# Patient Record
Sex: Male | Born: 1955 | Race: Black or African American | Hispanic: No | Marital: Married | Smoking: Never smoker
Health system: Southern US, Community
[De-identification: ages and names within clinical notes are randomized; demographics above are authoritative.]

## PROBLEM LIST (undated history)

## (undated) DIAGNOSIS — H409 Unspecified glaucoma: Secondary | ICD-10-CM

## (undated) DIAGNOSIS — J302 Other seasonal allergic rhinitis: Secondary | ICD-10-CM

## (undated) DIAGNOSIS — R011 Cardiac murmur, unspecified: Secondary | ICD-10-CM

## (undated) DIAGNOSIS — K4030 Unilateral inguinal hernia, with obstruction, without gangrene, not specified as recurrent: Secondary | ICD-10-CM

## (undated) DIAGNOSIS — N400 Enlarged prostate without lower urinary tract symptoms: Secondary | ICD-10-CM

## (undated) DIAGNOSIS — N390 Urinary tract infection, site not specified: Secondary | ICD-10-CM

## (undated) DIAGNOSIS — I10 Essential (primary) hypertension: Secondary | ICD-10-CM

## (undated) HISTORY — PX: MULTIPLE TOOTH EXTRACTIONS: SHX2053

---
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
## 2005-12-23 ENCOUNTER — Ambulatory Visit (HOSPITAL_COMMUNITY): Admission: RE | Admit: 2005-12-23 | Discharge: 2005-12-23 | Payer: Self-pay | Admitting: Family Medicine

## 2007-08-06 IMAGING — CR DG CHEST 2V
2 series · 2 of 2 positions shown · non-contrast
Comparison: none

CLINICAL DATA: Cough

Chest 2 view:
No previous for comparison. The heart size and mediastinal contours are within
normal limits.  Both lungs are clear.  The visualized skeletal structures are
unremarkable.

[view not recorded (1 of 2)]
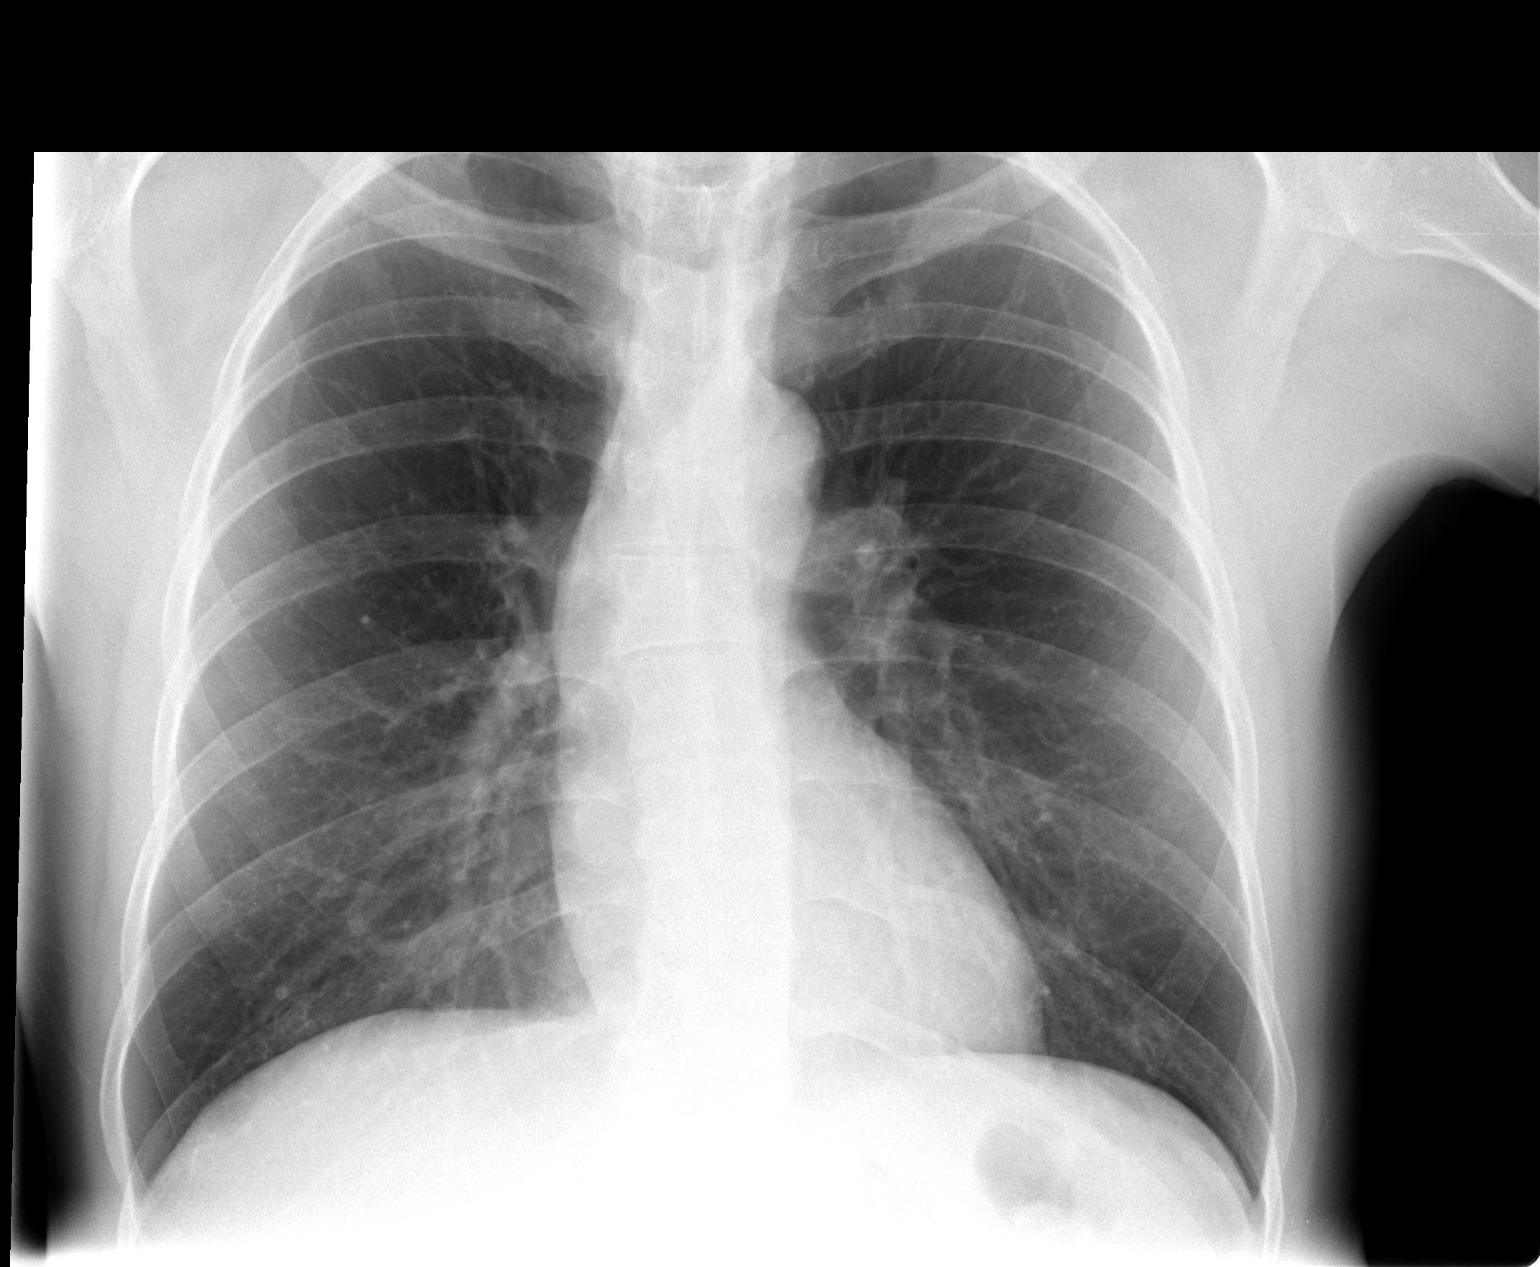

[view not recorded (2 of 2)]
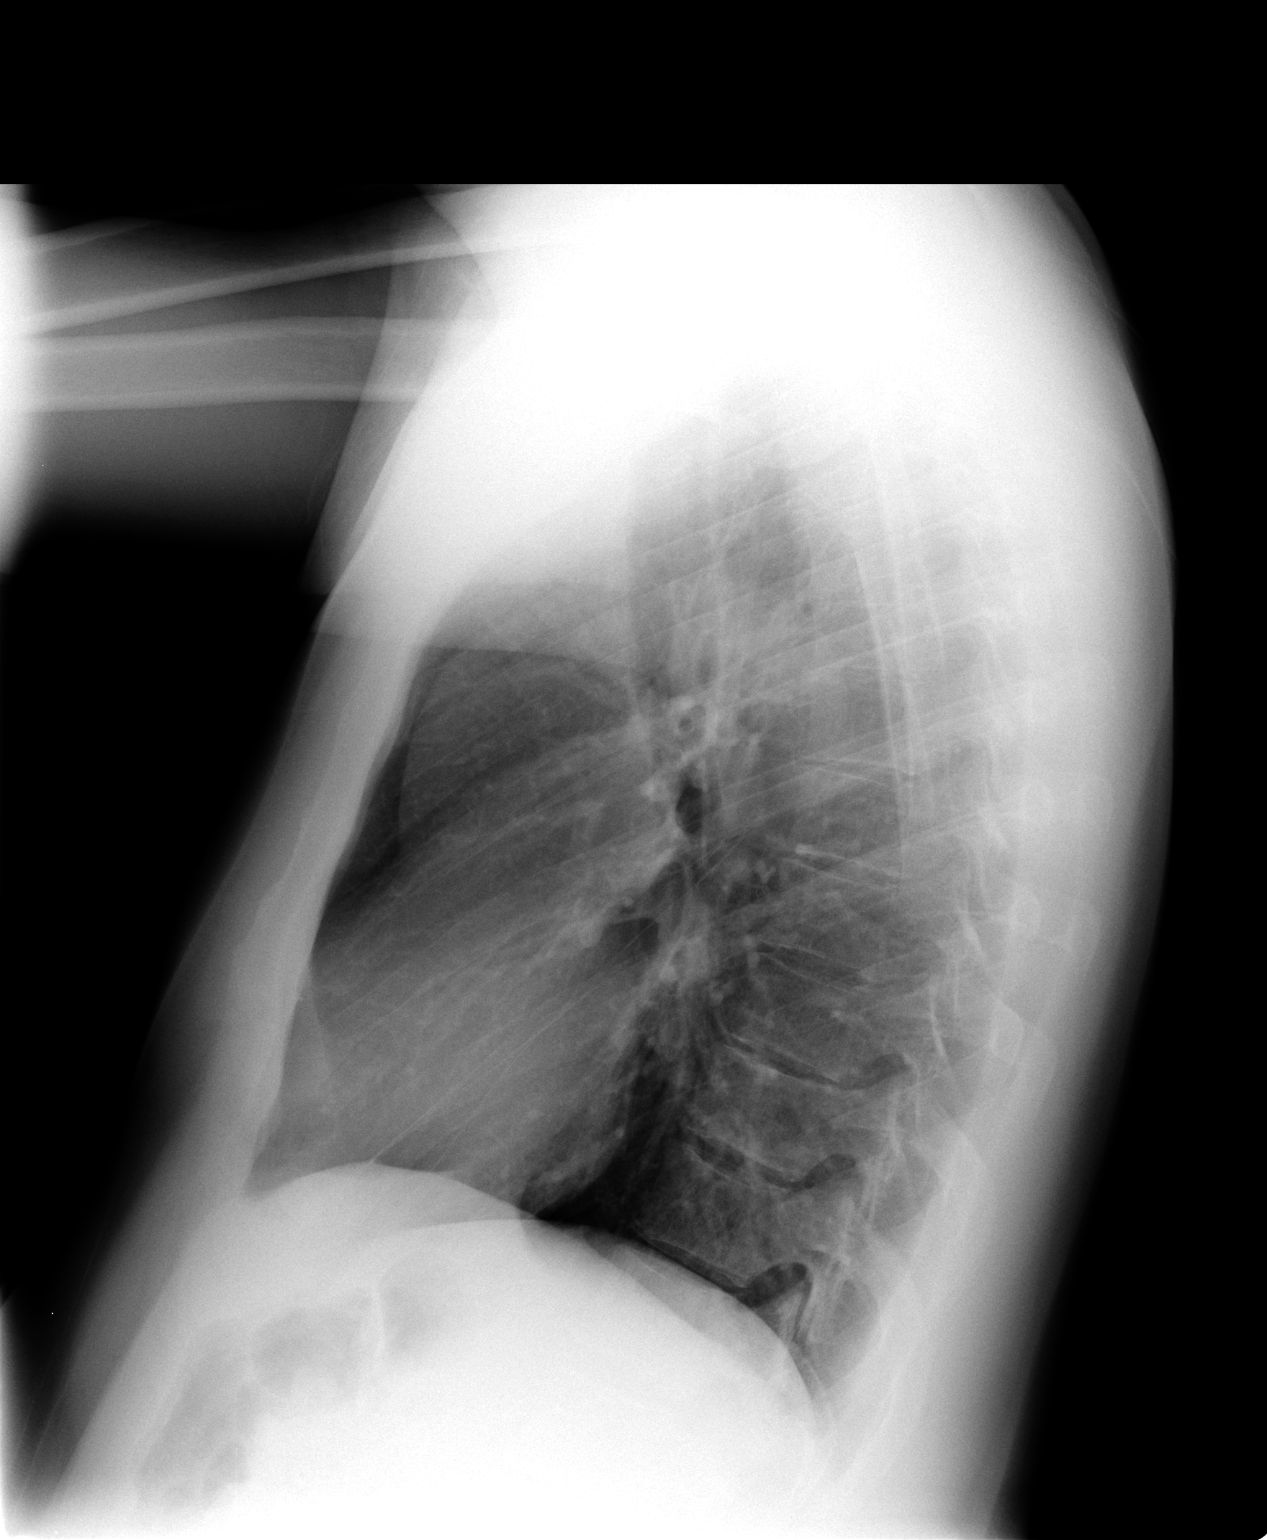

[2 of 2 positions shown; findings below may reference images not displayed]

IMPRESSION: 1. No active cardiopulmonary disease.

## 2013-04-02 ENCOUNTER — Ambulatory Visit (INDEPENDENT_AMBULATORY_CARE_PROVIDER_SITE_OTHER): Payer: No Typology Code available for payment source | Admitting: Surgery

## 2013-04-02 ENCOUNTER — Encounter (INDEPENDENT_AMBULATORY_CARE_PROVIDER_SITE_OTHER): Payer: Self-pay | Admitting: Surgery

## 2013-04-02 VITALS — BP 138/80 | HR 79 | Temp 98.0°F | Resp 18 | Ht 71.0 in | Wt 205.0 lb

## 2013-04-02 DIAGNOSIS — K4090 Unilateral inguinal hernia, without obstruction or gangrene, not specified as recurrent: Secondary | ICD-10-CM | POA: Insufficient documentation

## 2013-04-02 NOTE — Patient Instructions (Signed)
Inguinal Hernia, Adult  Care After Refer to this sheet in the next few weeks. These discharge instructions provide you with general information on caring for yourself after you leave the hospital. Your caregiver may also give you specific instructions. Your treatment has been planned according to the most current medical practices available, but unavoidable complications sometimes occur. If you have any problems or questions after discharge, please call your caregiver. HOME CARE INSTRUCTIONS  Put ice on the operative site.  Put ice in a plastic bag.  Place a towel between your skin and the bag.  Leave the ice on for 15-20 minutes at a time, 3-4 times a day while awake.  Change bandages (dressings) as directed.  Keep the wound dry and clean. The wound may be washed gently with soap and water. Gently blot or dab the wound dry. It is okay to take showers 24 to 48 hours after surgery. Do not take baths, use swimming pools, or use hot tubs for 10 days, or as directed by your caregiver.  Only take over-the-counter or prescription medicines for pain, discomfort, or fever as directed by your caregiver.  Continue your normal diet as directed.  Do not lift anything more than 10 pounds or play contact sports for 3 weeks, or as directed. SEEK MEDICAL CARE IF:  There is redness, swelling, or increasing pain in the wound.  There is fluid (pus) coming from the wound.  There is drainage from a wound lasting longer than 1 day.  You have an oral temperature above 102 F (38.9 C).  You notice a bad smell coming from the wound or dressing.  The wound breaks open after the stitches (sutures) have been removed.  You notice increasing pain in the shoulders (shoulder strap areas).  You develop dizzy episodes or fainting while standing.  You feel sick to your stomach (nauseous) or throw up (vomit). SEEK IMMEDIATE MEDICAL CARE IF:  You develop a rash.  You have difficulty breathing.  You  develop a reaction or have side effects to medicines you were given. MAKE SURE YOU:   Understand these instructions.  Will watch your condition.  Will get help right away if you are not doing well or get worse. Document Released: 11/11/2006 Document Revised: 01/03/2012 Document Reviewed: 09/10/2009 [facility] Patient Information 2014 [place], [place]. Inguinal Hernia, Adult Muscles help keep everything in the body in its proper place. But if a weak spot in the muscles develops, something can poke through. That is called a hernia. When this happens in the lower part of the belly (abdomen), it is called an inguinal hernia. (It takes its name from a part of the body in this region called the inguinal canal.) A weak spot in the wall of muscles lets some fat or part of the small intestine bulge through. An inguinal hernia can develop at any age. Men get them more often than women. CAUSES  In adults, an inguinal hernia develops over time.  It can be triggered by:  Suddenly straining the muscles of the lower abdomen.  Lifting heavy objects.  Straining to have a bowel movement. Difficult bowel movements (constipation) can lead to this.  Constant coughing. This may be caused by smoking or lung disease.  Being overweight.  Being pregnant.  Working at a job that requires long periods of standing or heavy lifting.  Having had an inguinal hernia before. One type can be an emergency situation. It is called a strangulated inguinal hernia. It develops if part of the small  intestine slips through the weak spot and cannot get back into the abdomen. The blood supply can be cut off. If that happens, part of the intestine may die. This situation requires emergency surgery. SYMPTOMS  Often, a small inguinal hernia has no symptoms. It is found when a healthcare provider does a physical exam. Larger hernias usually have symptoms.   In adults, symptoms may include:  A lump in the groin. This is easier to  see when the person is standing. It might disappear when lying down.  In men, a lump in the scrotum.  Pain or burning in the groin. This occurs especially when lifting, straining or coughing.  A dull ache or feeling of pressure in the groin.  Signs of a strangulated hernia can include:  A bulge in the groin that becomes very painful and tender to the touch.  A bulge that turns red or purple.  Fever, nausea and vomiting.  Inability to have a bowel movement or to pass gas. DIAGNOSIS  To decide if you have an inguinal hernia, a healthcare provider will probably do a physical examination.  This will include asking questions about any symptoms you have noticed.  The healthcare provider might feel the groin area and ask you to cough. If an inguinal hernia is felt, the healthcare provider may try to slide it back into the abdomen.  Usually no other tests are needed. TREATMENT  Treatments can vary. The size of the hernia makes a difference. Options include:  Watchful waiting. This is often suggested if the hernia is small and you have had no symptoms.  No medical procedure will be done unless symptoms develop.  You will need to watch closely for symptoms. If any occur, contact your healthcare provider right away.  Surgery. This is used if the hernia is larger or you have symptoms.  Open surgery. This is usually an outpatient procedure (you will not stay overnight in a hospital). An cut (incision) is made through the skin in the groin. The hernia is put back inside the abdomen. The weak area in the muscles is then repaired by herniorrhaphy or hernioplasty. Herniorrhaphy: in this type of surgery, the weak muscles are sewn back together. Hernioplasty: a patch or mesh is used to close the weak area in the abdominal wall.  Laparoscopy. In this procedure, a surgeon makes small incisions. A thin tube with a tiny video camera (called a laparoscope) is put into the abdomen. The surgeon repairs  the hernia with mesh by looking with the video camera and using two long instruments. HOME CARE INSTRUCTIONS   After surgery to repair an inguinal hernia:  You will need to take pain medicine prescribed by your healthcare provider. Follow all directions carefully.  You will need to take care of the wound from the incision.  Your activity will be restricted for awhile. This will probably include no heavy lifting for several weeks. You also should not do anything too active for a few weeks. When you can return to work will depend on the type of job that you have.  During "watchful waiting" periods, you should:  Maintain a healthy weight.  Eat a diet high in fiber (fruits, vegetables and whole grains).  Drink plenty of fluids to avoid constipation. This means drinking enough water and other liquids to keep your urine clear or pale yellow.  Do not lift heavy objects.  Do not stand for long periods of time.  Quit smoking. This should keep you from developing a   frequent cough. SEEK MEDICAL CARE IF:   A bulge develops in your groin area.  You feel pain, a burning sensation or pressure in the groin. This might be worse if you are lifting or straining.  You develop a fever of more than 100.5 F (38.1 C). SEEK IMMEDIATE MEDICAL CARE IF:   Pain in the groin increases suddenly.  A bulge in the groin gets bigger suddenly and does not go down.  For men, there is sudden pain in the scrotum. Or, the size of the scrotum increases.  A bulge in the groin area becomes red or purple and is painful to touch.  You have nausea or vomiting that does not go away.  You feel your heart beating much faster than normal.  You cannot have a bowel movement or pass gas.  You develop a fever of more than 102.0 F (38.9 C). Document Released: 02/27/2009 Document Revised: 01/03/2012 Document Reviewed: 02/27/2009 [employer] Patient Information 2014 [employer].  

## 2013-04-02 NOTE — Progress Notes (Signed)
Patient ID: Jimmy Mcclure, male   DOB: [birth year], 57 y.o.   MRN: [id number]  Chief Complaint  Patient presents with  . New Evaluation    L/H    HPI Jimmy Mcclure is a 57 y.o. male.  Patient presents with chief complaint of left groin pain. Pain is present for 2-3 months. It is associated with left groin swelling. The swelling is variable. Noticed more when standing or exerting. The pain is dull in nature made worse with activity. No pain in testicle. No change in bowel or bladder function. HPI  History reviewed. No pertinent past medical history.  Past Surgical History  Procedure Laterality Date  . Heart cather    . Cardiac catheterization  1966    age 41 yrs    Family History  Problem Relation Age of Onset  . Cancer Mother     lung ca  . Heart disease Father   . Dementia Father   . Diabetes Father   . Hypertension Sister   . Crohn's disease Daughter     Social History History  Substance Use Topics  . Smoking status: Never Smoker   . Smokeless tobacco: Not on file  . Alcohol Use: Not on file    No Known Allergies  Current Outpatient Prescriptions  Medication Sig Dispense Refill  . amLODipine (NORVASC) 5 MG tablet Take 5 mg by mouth daily.      . bimatoprost (LUMIGAN) 0.03 % ophthalmic solution 1 drop at bedtime.      . brimonidine-timolol (COMBIGAN) 0.2-0.5 % ophthalmic solution Place 1 drop into both eyes every 12 (twelve) hours.      . dorzolamide (TRUSOPT) 2 % ophthalmic solution 1 drop 3 (three) times daily.      Marland Kitchen triamterene-hydrochlorothiazide (DYAZIDE) 37.5-25 MG per capsule Take 1 capsule by mouth every morning.       No current facility-administered medications for this visit.    Review of Systems Review of Systems  Constitutional: Negative for fever, chills and unexpected weight change.  HENT: Negative for hearing loss, congestion, sore throat, trouble swallowing and voice change.   Eyes: Negative for visual disturbance.  Respiratory: Positive for  cough. Negative for wheezing.   Cardiovascular: Negative for chest pain, palpitations and leg swelling.  Gastrointestinal: Negative for nausea, vomiting, abdominal pain, diarrhea, constipation, blood in stool, abdominal distention, anal bleeding and rectal pain.  Genitourinary: Negative for hematuria and difficulty urinating.  Musculoskeletal: Negative for arthralgias.  Skin: Negative for rash and wound.  Neurological: Negative for seizures, syncope, weakness and headaches.  Hematological: Negative for adenopathy. Does not bruise/bleed easily.  Psychiatric/Behavioral: Negative for confusion.    Blood pressure 138/80, pulse 79, temperature 98 F (36.7 C), resp. rate 18, height 5\' 11"  (1.803 m), weight 205 lb (92.987 kg).  Physical Exam Physical Exam  Constitutional: He is oriented to person, place, and time. He appears well-developed and well-nourished.  HENT:  Head: Normocephalic and atraumatic.  Eyes: EOM are normal. Pupils are equal, round, and reactive to light.  Neck: Normal range of motion. Neck supple.  Cardiovascular: Normal rate and regular rhythm.   Pulmonary/Chest: Effort normal.  Abdominal: Soft. Bowel sounds are normal. A hernia is present. Hernia confirmed positive in the left inguinal area. Hernia confirmed negative in the right inguinal area.  Genitourinary:     Musculoskeletal: Normal range of motion.  Neurological: He is alert and oriented to person, place, and time.  Skin: Skin is warm and dry.  Psychiatric: He has a normal mood and affect.  His behavior is normal. Judgment and thought content normal.    Data Reviewed none  Assessment    LIH    Plan    Repair LIH. The risk of hernia repair include bleeding,  Infection,   Recurrence of the hernia,  Mesh use, chronic pain,  Organ injury,  Bowel injury,  Bladder injury,   nerve injury with numbness around the incision,  Death,  and worsening of preexisting  medical problems.  The alternatives to surgery have  been discussed as well..  Long term expectations of both operative and non operative treatments have been discussed.   The patient agrees to proceed.       Shann Merrick A. 04/02/2013, 11:41 AM

## 2014-05-30 ENCOUNTER — Encounter (HOSPITAL_COMMUNITY): Payer: Self-pay | Admitting: Emergency Medicine

## 2014-05-30 ENCOUNTER — Emergency Department (HOSPITAL_COMMUNITY)
Admission: EM | Admit: 2014-05-30 | Discharge: 2014-05-30 | Disposition: A | Discharge status: Home / Self Care | Payer: 59 | Attending: Emergency Medicine | Admitting: Emergency Medicine

## 2014-05-30 DIAGNOSIS — R42 Dizziness and giddiness: Secondary | ICD-10-CM | POA: Insufficient documentation

## 2014-05-30 DIAGNOSIS — R011 Cardiac murmur, unspecified: Secondary | ICD-10-CM | POA: Diagnosis not present

## 2014-05-30 DIAGNOSIS — I10 Essential (primary) hypertension: Secondary | ICD-10-CM | POA: Diagnosis not present

## 2014-05-30 DIAGNOSIS — R350 Frequency of micturition: Secondary | ICD-10-CM | POA: Diagnosis not present

## 2014-05-30 DIAGNOSIS — R197 Diarrhea, unspecified: Secondary | ICD-10-CM | POA: Insufficient documentation

## 2014-05-30 DIAGNOSIS — R5381 Other malaise: Secondary | ICD-10-CM | POA: Insufficient documentation

## 2014-05-30 DIAGNOSIS — Z79899 Other long term (current) drug therapy: Secondary | ICD-10-CM | POA: Insufficient documentation

## 2014-05-30 DIAGNOSIS — R110 Nausea: Secondary | ICD-10-CM | POA: Diagnosis present

## 2014-05-30 DIAGNOSIS — H409 Unspecified glaucoma: Secondary | ICD-10-CM | POA: Diagnosis not present

## 2014-05-30 DIAGNOSIS — R5383 Other fatigue: Secondary | ICD-10-CM | POA: Diagnosis not present

## 2014-05-30 DIAGNOSIS — R531 Weakness: Secondary | ICD-10-CM

## 2014-05-30 HISTORY — DX: Essential (primary) hypertension: I10

## 2014-05-30 HISTORY — DX: Unspecified glaucoma: H40.9

## 2014-05-30 LAB — CBC WITH DIFFERENTIAL/PLATELET
BASOS PCT: 0 % (ref 0–1)
Basophils Absolute: 0 10*3/uL (ref 0.0–0.1)
Eosinophils Absolute: 0.1 10*3/uL (ref 0.0–0.7)
Eosinophils Relative: 1 % (ref 0–5)
HCT: 40.9 % (ref 39.0–52.0)
HEMOGLOBIN: 13.8 g/dL (ref 13.0–17.0)
LYMPHS ABS: 1.1 10*3/uL (ref 0.7–4.0)
Lymphocytes Relative: 13 % (ref 12–46)
MCH: 26.7 pg (ref 26.0–34.0)
MCHC: 33.7 g/dL (ref 30.0–36.0)
MCV: 79.3 fL (ref 78.0–100.0)
MONOS PCT: 6 % (ref 3–12)
Monocytes Absolute: 0.5 10*3/uL (ref 0.1–1.0)
Neutro Abs: 6.6 10*3/uL (ref 1.7–7.7)
Neutrophils Relative %: 80 % — ABNORMAL HIGH (ref 43–77)
PLATELETS: 220 10*3/uL (ref 150–400)
RBC: 5.16 MIL/uL (ref 4.22–5.81)
RDW: 14.7 % (ref 11.5–15.5)
WBC: 8.3 10*3/uL (ref 4.0–10.5)

## 2014-05-30 LAB — URINE MICROSCOPIC-ADD ON

## 2014-05-30 LAB — URINALYSIS, ROUTINE W REFLEX MICROSCOPIC
BILIRUBIN URINE: NEGATIVE
GLUCOSE, UA: NEGATIVE mg/dL
HGB URINE DIPSTICK: NEGATIVE
KETONES UR: NEGATIVE mg/dL
NITRITE: NEGATIVE
PROTEIN: NEGATIVE mg/dL
Specific Gravity, Urine: 1.016 (ref 1.005–1.030)
UROBILINOGEN UA: 0.2 mg/dL (ref 0.0–1.0)
pH: 6.5 (ref 5.0–8.0)

## 2014-05-30 LAB — COMPREHENSIVE METABOLIC PANEL
ALBUMIN: 4 g/dL (ref 3.5–5.2)
ALT: 26 U/L (ref 0–53)
ANION GAP: 12 (ref 5–15)
AST: 20 U/L (ref 0–37)
Alkaline Phosphatase: 57 U/L (ref 39–117)
BUN: 16 mg/dL (ref 6–23)
CALCIUM: 9.3 mg/dL (ref 8.4–10.5)
CHLORIDE: 102 meq/L (ref 96–112)
CO2: 31 meq/L (ref 19–32)
CREATININE: 1.33 mg/dL (ref 0.50–1.35)
GFR calc Af Amer: 66 mL/min — ABNORMAL LOW (ref >90)
GFR calc non Af Amer: 57 mL/min — ABNORMAL LOW (ref >90)
Glucose, Bld: 138 mg/dL — ABNORMAL HIGH (ref 70–99)
POTASSIUM: 2.9 meq/L — AB (ref 3.7–5.3)
Sodium: 145 mEq/L (ref 137–147)
TOTAL PROTEIN: 7.7 g/dL (ref 6.0–8.3)
Total Bilirubin: 1.3 mg/dL — ABNORMAL HIGH (ref 0.3–1.2)

## 2014-05-30 LAB — I-STAT TROPONIN, ED
TROPONIN I, POC: 0 ng/mL (ref 0.00–0.08)
TROPONIN I, POC: 0 ng/mL (ref 0.00–0.08)

## 2014-05-30 MED ORDER — POTASSIUM CHLORIDE CRYS ER 20 MEQ PO TBCR: 20.0000 meq | EXTENDED_RELEASE_TABLET | Freq: Every day | ORAL | Status: DC

## 2014-05-30 MED ORDER — POTASSIUM CHLORIDE CRYS ER 20 MEQ PO TBCR
40.0000 meq | EXTENDED_RELEASE_TABLET | Freq: Once | ORAL | Status: AC
  Administered 2014-05-30: 40 meq via ORAL
  Filled 2014-05-30: qty 2

## 2014-05-30 NOTE — Discharge Instructions (Signed)
Call Dr. Ronne BinningMcKenzie today to schedule appointment for within one week. Your blood potassium today was 2.9. Your blood sugar was 138. Ask Dr. Ronne BinningMcKenzie to order a hemoglobin A1c, and to recheck your blood sugar and blood potassium at your next office visit

## 2014-05-30 NOTE — ED Provider Notes (Signed)
CSN: [id number]     Arrival date & time 05/30/14  0740 History   First MD Initiated Contact with Patient 05/30/14 0745     Chief Complaint  Patient presents with  . Nausea  . Dizziness     (Consider location/radiation/quality/duration/timing/severity/associated sxs/prior Treatment) HPI Patient awakened at 6:30 AM today feeling mildly lightheaded, he also reports feeling sweaty and nauseated. He had urinary frequency omst last night and also missed one episode of nonbloody diarrhea this morning. He treated himself with his usual blood pressure medications this morning. He feels improved presently. No longer nauseated. No vertigo. No chest pain no headache no abdominal pain no visual changes. Symptoms improved spontaneously. Past Medical History  Diagnosis Date  . Hypertension   . Glaucoma    heart murmur Past Surgical History  Procedure Laterality Date  . Heart cather    . Cardiac catheterization  1966    age 40 yrs   Family History  Problem Relation Age of Onset  . Cancer Mother     lung ca  . Heart disease Father   . Dementia Father   . Diabetes Father   . Hypertension Sister   . Crohn's disease Daughter    History  Substance Use Topics  . Smoking status: Never Smoker   . Smokeless tobacco: Not on file  . Alcohol Use: No    Review of Systems  HENT: Negative.   Respiratory: Negative.   Cardiovascular: Negative.   Gastrointestinal: Positive for nausea and diarrhea.  Genitourinary: Positive for frequency.  Musculoskeletal: Negative.   Skin: Negative.   Neurological: Positive for weakness.  Psychiatric/Behavioral: Negative.   All other systems reviewed and are negative.     Allergies  Review of patient's allergies indicates no known allergies.  Home Medications   Prior to Admission medications   Medication Sig Start Date End Date Taking? Authorizing Provider  amLODipine (NORVASC) 5 MG tablet Take 5 mg by mouth daily.    Historical Provider, MD  bimatoprost  (LUMIGAN) 0.03 % ophthalmic solution 1 drop at bedtime.    Historical Provider, MD  brimonidine-timolol (COMBIGAN) 0.2-0.5 % ophthalmic solution Place 1 drop into both eyes every 12 (twelve) hours.    Historical Provider, MD  dorzolamide (TRUSOPT) 2 % ophthalmic solution 1 drop 3 (three) times daily.    Historical Provider, MD  triamterene-hydrochlorothiazide (DYAZIDE) 37.5-25 MG per capsule Take 1 capsule by mouth every morning.    Historical Provider, MD   BP 130/72  Pulse 73  Temp(Src) 97.8 F (36.6 C) (Oral)  Resp 18  SpO2 98% Physical Exam  Nursing note and vitals reviewed. Constitutional: He is oriented to person, place, and time. He appears well-developed and well-nourished.  HENT:  Head: Normocephalic and atraumatic.  Eyes: Conjunctivae are normal. Pupils are equal, round, and reactive to light.  Neck: Neck supple. No tracheal deviation present. No thyromegaly present.  Cardiovascular: Normal rate and regular rhythm.   No murmur heard. Pulmonary/Chest: Effort normal and breath sounds normal.  Abdominal: Soft. Bowel sounds are normal. He exhibits no distension and no mass. There is no tenderness.  Genitourinary: Penis normal.  Musculoskeletal: Normal range of motion. He exhibits no edema and no tenderness.  Neurological: He is alert and oriented to person, place, and time. No cranial nerve deficit. Coordination normal.  Skin: Skin is warm and dry. No rash noted.  Psychiatric: He has a normal mood and affect.    ED Course  Procedures (including critical care time) Labs Review Labs Reviewed - No  data to display  Imaging Review No results found.   EKG Interpretation   Date/Time:  Thursday May 30 2014 07:43:08 EDT Ventricular Rate:  72 PR Interval:  180 QRS Duration: 110 QT Interval:  418 QTC Calculation: 457 R Axis:   78 Text Interpretation:  Normal sinus rhythm Minimal voltage criteria for  LVH, may be normal variant Nonspecific T wave abnormality Abnormal ECG  No  old tracing to compare Confirmed by Chanti Golubski  MD, Tierney Behl [phone]([id number]) on  05/30/2014 7:53:57 AM      1:30 PM patient asymptomatic resting comfortably. Asymptomatic Results for orders placed during the hospital encounter of 05/30/14  COMPREHENSIVE METABOLIC PANEL      Result Value Ref Range   Sodium 145  137 - 147 mEq/L   Potassium 2.9 (*) 3.7 - 5.3 mEq/L   Chloride 102  96 - 112 mEq/L   CO2 31  19 - 32 mEq/L   Glucose, Bld 138 (*) 70 - 99 mg/dL   BUN 16  6 - 23 mg/dL   Creatinine, Ser 1.471.33  0.50 - 1.35 mg/dL   Calcium 9.3  8.4 - 82.910.5 mg/dL   Total Protein 7.7  6.0 - 8.3 g/dL   Albumin 4.0  3.5 - 5.2 g/dL   AST 20  0 - 37 U/L   ALT 26  0 - 53 U/L   Alkaline Phosphatase 57  39 - 117 U/L   Total Bilirubin 1.3 (*) 0.3 - 1.2 mg/dL   GFR calc non Af Amer 57 (*) >90 mL/min   GFR calc Af Amer 66 (*) >90 mL/min   Anion gap 12  5 - 15  CBC WITH DIFFERENTIAL      Result Value Ref Range   WBC 8.3  4.0 - 10.5 K/uL   RBC 5.16  4.22 - 5.81 MIL/uL   Hemoglobin 13.8  13.0 - 17.0 g/dL   HCT 56.240.9  13.039.0 - 86.552.0 %   MCV 79.3  78.0 - 100.0 fL   MCH 26.7  26.0 - 34.0 pg   MCHC 33.7  30.0 - 36.0 g/dL   RDW 78.414.7  69.611.5 - 29.515.5 %   Platelets 220  150 - 400 K/uL   Neutrophils Relative % 80 (*) 43 - 77 %   Neutro Abs 6.6  1.7 - 7.7 K/uL   Lymphocytes Relative 13  12 - 46 %   Lymphs Abs 1.1  0.7 - 4.0 K/uL   Monocytes Relative 6  3 - 12 %   Monocytes Absolute 0.5  0.1 - 1.0 K/uL   Eosinophils Relative 1  0 - 5 %   Eosinophils Absolute 0.1  0.0 - 0.7 K/uL   Basophils Relative 0  0 - 1 %   Basophils Absolute 0.0  0.0 - 0.1 K/uL  URINALYSIS, ROUTINE W REFLEX MICROSCOPIC      Result Value Ref Range   Color, Urine YELLOW  YELLOW   APPearance CLEAR  CLEAR   Specific Gravity, Urine 1.016  1.005 - 1.030   pH 6.5  5.0 - 8.0   Glucose, UA NEGATIVE  NEGATIVE mg/dL   Hgb urine dipstick NEGATIVE  NEGATIVE   Bilirubin Urine NEGATIVE  NEGATIVE   Ketones, ur NEGATIVE  NEGATIVE mg/dL   Protein, ur NEGATIVE   NEGATIVE mg/dL   Urobilinogen, UA 0.2  0.0 - 1.0 mg/dL   Nitrite NEGATIVE  NEGATIVE   Leukocytes, UA MODERATE (*) NEGATIVE  URINE MICROSCOPIC-ADD ON      Result Value Ref Range  WBC, UA 7-10  <3 WBC/hpf   RBC / HPF 0-2  <3 RBC/hpf   Bacteria, UA RARE  RARE  I-STAT TROPOININ, ED      Result Value Ref Range   Troponin i, poc 0.00  0.00 - 0.08 ng/mL   Comment 3           I-STAT TROPOININ, ED      Result Value Ref Range   Troponin i, poc 0.00  0.00 - 0.08 ng/mL   Comment 3            No results found.  MDM  Symptoms atypical for acute coronary syndrome. No chest pain no acute EKG changes 2 negative troponins Patient had no chest pain. Plan prescription K-Dur. Patient is instructed to followup with Dr. Ronne Binning this week to arrange to be seen within a week Diagnosis #1 transient weakness #2 hypokalemia #3 hyperglycemia Final diagnoses:  None        Doug Sou, MD 05/30/14 1649

## 2014-05-30 NOTE — ED Notes (Signed)
Pt reports dizziness laid down and was sweating profusely and then with ambulation got nauseas.

## 2014-05-30 NOTE — ED Notes (Signed)
Critical lab to Dr. Ethelda ChickJacubowitz

## 2014-05-30 NOTE — ED Notes (Signed)
MD at bedside. 

## 2014-05-30 NOTE — ED Notes (Signed)
Pt sts frequent urination last night with some diaphoresis, dizziness and nausea this am; pt sts one episode of diarrhea; pt denies pain

## 2014-05-31 LAB — URINE CULTURE

## 2014-05-31 LAB — HEMOGLOBIN A1C
Hgb A1c MFr Bld: 6.1 % — ABNORMAL HIGH (ref <5.7)
MEAN PLASMA GLUCOSE: 128 mg/dL — AB (ref <117)

## 2016-06-25 DIAGNOSIS — K4030 Unilateral inguinal hernia, with obstruction, without gangrene, not specified as recurrent: Secondary | ICD-10-CM

## 2016-06-25 HISTORY — DX: Unilateral inguinal hernia, with obstruction, without gangrene, not specified as recurrent: K40.30

## 2016-07-11 DIAGNOSIS — N390 Urinary tract infection, site not specified: Secondary | ICD-10-CM

## 2016-07-11 HISTORY — DX: Urinary tract infection, site not specified: N39.0

## 2016-07-15 ENCOUNTER — Encounter (HOSPITAL_BASED_OUTPATIENT_CLINIC_OR_DEPARTMENT_OTHER)
Admission: RE | Admit: 2016-07-15 | Discharge: 2016-07-15 | Disposition: A | Discharge status: Home / Self Care | Payer: BLUE CROSS/BLUE SHIELD | Source: Ambulatory Visit | Attending: Surgery | Admitting: Surgery

## 2016-07-15 ENCOUNTER — Encounter (HOSPITAL_BASED_OUTPATIENT_CLINIC_OR_DEPARTMENT_OTHER): Payer: Self-pay | Admitting: *Deleted

## 2016-07-15 ENCOUNTER — Other Ambulatory Visit: Payer: Self-pay | Admitting: Surgery

## 2016-07-15 ENCOUNTER — Other Ambulatory Visit: Payer: Self-pay

## 2016-07-15 DIAGNOSIS — I10 Essential (primary) hypertension: Secondary | ICD-10-CM | POA: Diagnosis present

## 2016-07-15 DIAGNOSIS — Z79899 Other long term (current) drug therapy: Secondary | ICD-10-CM | POA: Diagnosis not present

## 2016-07-15 DIAGNOSIS — K4030 Unilateral inguinal hernia, with obstruction, without gangrene, not specified as recurrent: Secondary | ICD-10-CM | POA: Diagnosis present

## 2016-07-15 NOTE — H&P (Signed)
  Jimmy BlanksWilliam S. Freida Mcclure 07/15/2016 9:32 AM Location: [facility] Patient #: [id number] DOB: 22-Apr-1956 Married / Language: English / Race: Black or African American Male   History of Present Illness (Jimmy Mcclure A. Jimmy IvanBlackman MD; 07/15/2016 9:58 AM) Patient words: New-Inguinal hernia.  The patient is a 60 year old male who presents with an inguinal hernia. This is a pleasant gentleman referred to me by Dr. Billee CashingWayland Mcclure for evaluation of a left inguinal hernia. The patient reports that he has had a hernia for possibly 5 years and they always easily reduces until last week. He reports that is stuck out after doing lifting and he developed nausea and vomiting. He improved after several days and today feels well. He reports is that the hernia still comes in and goes out fairly easily. He has been moving his bowels. Again, he denies abdominal pain this morning.   Allergies Jimmy Mcclure(Jimmy Mcclure, CMA; 07/15/2016 9:33 AM) No Known Drug Allergies09/21/2017  Medication History Jimmy Mcclure(Jimmy Mcclure, CMA; 07/15/2016 9:33 AM) AmLODIPine Besylate (10MG  Tablet, Oral) Active. Combigan (0.2-0.5% Solution, Ophthalmic) Active. Dorzolamide HCl (2% Solution, Ophthalmic) Active. FreeStyle Lancets Active. FreeStyle Lite Test (In Vitro) Active. Sulfamethoxazole-Trimethoprim (800-160MG  Tablet, Oral) Active. Triamterene-HCTZ (37.5-25MG  Tablet, Oral) Active. Medications Reconciled  Vitals (Jimmy Mcclure CMA; 07/15/2016 9:33 AM) 07/15/2016 9:33 AM Weight: 168.4 lb Height: 71in Body Surface Area: 1.96 m Body Mass Index: 23.49 kg/m  Temp.: 97.90F(Oral)  BP: 124/78 (Sitting, Left Arm, Standard)       Physical Exam (Jimmy Mcclure A. Jimmy IvanBlackman MD; 07/15/2016 9:58 AM) General Mental Status-Alert. General Appearance-Consistent with stated age. Hydration-Well hydrated. Voice-Normal.  Head and Neck Head-normocephalic, atraumatic with no lesions or palpable  masses. Trachea-midline.  Eye Eyeball - Bilateral-Extraocular movements intact. Sclera/Conjunctiva - Bilateral-No scleral icterus.  Chest and Lung Exam Chest and lung exam reveals -quiet, even and easy respiratory effort with no use of accessory muscles and on auscultation, normal breath sounds, no adventitious sounds and normal vocal resonance. Inspection Chest Wall - Normal. Back - normal.  Cardiovascular Cardiovascular examination reveals -normal heart sounds, regular rate and rhythm with no murmurs and normal pedal pulses bilaterally.  Abdomen Inspection Skin - Scar - no surgical scars. Hernias - Inguinal hernia - Left - Incarcerated. Note: There is a large incarcerated inguinal hernia with mild tenderness. The rest of his abdomen is soft and nontender. Palpation/Percussion Palpation and Percussion of the abdomen reveal - Soft, Non Tender, No Rebound tenderness, No Rigidity (guarding) and No hepatosplenomegaly. Auscultation Auscultation of the abdomen reveals - Bowel sounds normal.  Neurologic - Did not examine.  Musculoskeletal - Did not examine.    Assessment & Plan (Mekenna Finau A. Jimmy IvanBlackman MD; 07/15/2016 10:00 AM) Dorethea ClanINCARCERATED LEFT INGUINAL HERNIA (K40.30) Impression: I discussed the diagnosis with him. Urgent repair of the hernia is recommended to prevent bowel compromise. Again, he is in no distress today so we will schedule this for tomorrow in the operating room unless he develops abdominal pain or nausea and vomiting today. I discussed the surgical procedure with him in detail. I discussed use of mesh. I discussed the risks of surgery which includes but is not limited to bleeding, infection, recurrence, injury to surrounding structures, chronic pain, cardiopulmonary issues, etc. He understands and surgery will be scheduled urgently. We will proceed with left inguinal hernia repair with mesh

## 2016-07-15 NOTE — Pre-Procedure Instructions (Signed)
To come for BMET and EKG 

## 2016-07-16 ENCOUNTER — Ambulatory Visit (HOSPITAL_BASED_OUTPATIENT_CLINIC_OR_DEPARTMENT_OTHER)
Admission: RE | Admit: 2016-07-16 | Discharge: 2016-07-16 | Disposition: A | Discharge status: Home / Self Care | Payer: BLUE CROSS/BLUE SHIELD | Source: Ambulatory Visit | Attending: Surgery | Admitting: Surgery

## 2016-07-16 ENCOUNTER — Ambulatory Visit (HOSPITAL_BASED_OUTPATIENT_CLINIC_OR_DEPARTMENT_OTHER): Payer: BLUE CROSS/BLUE SHIELD | Admitting: Certified Registered"

## 2016-07-16 ENCOUNTER — Encounter (HOSPITAL_BASED_OUTPATIENT_CLINIC_OR_DEPARTMENT_OTHER)
Admission: RE | Disposition: A | Discharge status: Home / Self Care | Payer: Self-pay | Source: Ambulatory Visit | Attending: Surgery

## 2016-07-16 ENCOUNTER — Encounter (HOSPITAL_BASED_OUTPATIENT_CLINIC_OR_DEPARTMENT_OTHER): Payer: Self-pay | Admitting: Certified Registered"

## 2016-07-16 DIAGNOSIS — K4030 Unilateral inguinal hernia, with obstruction, without gangrene, not specified as recurrent: Secondary | ICD-10-CM | POA: Insufficient documentation

## 2016-07-16 DIAGNOSIS — Z79899 Other long term (current) drug therapy: Secondary | ICD-10-CM | POA: Insufficient documentation

## 2016-07-16 DIAGNOSIS — I10 Essential (primary) hypertension: Secondary | ICD-10-CM | POA: Insufficient documentation

## 2016-07-16 HISTORY — DX: Benign prostatic hyperplasia without lower urinary tract symptoms: N40.0

## 2016-07-16 HISTORY — DX: Other seasonal allergic rhinitis: J30.2

## 2016-07-16 HISTORY — PX: INGUINAL HERNIA REPAIR: SHX194

## 2016-07-16 HISTORY — DX: Cardiac murmur, unspecified: R01.1

## 2016-07-16 HISTORY — PX: INSERTION OF MESH: SHX5868

## 2016-07-16 HISTORY — DX: Unilateral inguinal hernia, with obstruction, without gangrene, not specified as recurrent: K40.30

## 2016-07-16 HISTORY — DX: Urinary tract infection, site not specified: N39.0

## 2016-07-16 SURGERY — REPAIR, HERNIA, INGUINAL, ADULT
Anesthesia: Regional | Site: Groin | Laterality: Left

## 2016-07-16 MED ORDER — METOCLOPRAMIDE HCL 5 MG/ML IJ SOLN
10.0000 mg | Freq: Once | INTRAMUSCULAR | Status: DC | PRN
Start: 2016-07-16 — End: 2016-07-16

## 2016-07-16 MED ORDER — HYDROCODONE-ACETAMINOPHEN 5-325 MG PO TABS: 1.0000 | ORAL_TABLET | ORAL | 0 refills | Status: DC | PRN

## 2016-07-16 MED ORDER — LACTATED RINGERS IV SOLN
INTRAVENOUS | Status: DC
  Administered 2016-07-16 (×3): via INTRAVENOUS

## 2016-07-16 MED ORDER — CHLORHEXIDINE GLUCONATE CLOTH 2 % EX PADS: 6.0000 | MEDICATED_PAD | Freq: Once | CUTANEOUS | Status: DC

## 2016-07-16 MED ORDER — LIDOCAINE 2% (20 MG/ML) 5 ML SYRINGE
INTRAMUSCULAR | Status: AC
  Filled 2016-07-16: qty 5

## 2016-07-16 MED ORDER — DEXAMETHASONE SODIUM PHOSPHATE 4 MG/ML IJ SOLN
INTRAMUSCULAR | Status: DC | PRN
  Administered 2016-07-16: 10 mg via INTRAVENOUS

## 2016-07-16 MED ORDER — GLYCOPYRROLATE 0.2 MG/ML IJ SOLN: 0.2000 mg | Freq: Once | INTRAMUSCULAR | Status: DC | PRN

## 2016-07-16 MED ORDER — ONDANSETRON HCL 4 MG/2ML IJ SOLN
INTRAMUSCULAR | Status: DC | PRN
  Administered 2016-07-16: 4 mg via INTRAVENOUS

## 2016-07-16 MED ORDER — OXYCODONE HCL 5 MG PO TABS
5.0000 mg | ORAL_TABLET | Freq: Once | ORAL | Status: AC
  Administered 2016-07-16: 5 mg via ORAL

## 2016-07-16 MED ORDER — FENTANYL CITRATE (PF) 100 MCG/2ML IJ SOLN: 25.0000 ug | INTRAMUSCULAR | Status: DC | PRN

## 2016-07-16 MED ORDER — SCOPOLAMINE 1 MG/3DAYS TD PT72: 1.0000 | MEDICATED_PATCH | Freq: Once | TRANSDERMAL | Status: DC | PRN

## 2016-07-16 MED ORDER — FENTANYL CITRATE (PF) 100 MCG/2ML IJ SOLN
INTRAMUSCULAR | Status: AC
  Filled 2016-07-16: qty 2

## 2016-07-16 MED ORDER — MIDAZOLAM HCL 2 MG/2ML IJ SOLN
1.0000 mg | INTRAMUSCULAR | Status: DC | PRN
  Administered 2016-07-16: 2 mg via INTRAVENOUS

## 2016-07-16 MED ORDER — PROPOFOL 10 MG/ML IV BOLUS
INTRAVENOUS | Status: AC
  Filled 2016-07-16: qty 20

## 2016-07-16 MED ORDER — CEFAZOLIN SODIUM-DEXTROSE 2-4 GM/100ML-% IV SOLN
INTRAVENOUS | Status: AC
  Filled 2016-07-16: qty 100

## 2016-07-16 MED ORDER — DEXAMETHASONE SODIUM PHOSPHATE 10 MG/ML IJ SOLN
INTRAMUSCULAR | Status: AC
  Filled 2016-07-16: qty 1

## 2016-07-16 MED ORDER — SUCCINYLCHOLINE CHLORIDE 20 MG/ML IJ SOLN
INTRAMUSCULAR | Status: DC | PRN
  Administered 2016-07-16: 100 mg via INTRAVENOUS

## 2016-07-16 MED ORDER — LIDOCAINE HCL (CARDIAC) 20 MG/ML IV SOLN
INTRAVENOUS | Status: DC | PRN
  Administered 2016-07-16: 100 mg via INTRAVENOUS

## 2016-07-16 MED ORDER — ONDANSETRON HCL 4 MG/2ML IJ SOLN
INTRAMUSCULAR | Status: AC
  Filled 2016-07-16: qty 2

## 2016-07-16 MED ORDER — OXYCODONE HCL 5 MG PO TABS
ORAL_TABLET | ORAL | Status: AC
  Filled 2016-07-16: qty 1

## 2016-07-16 MED ORDER — BUPIVACAINE HCL (PF) 0.5 % IJ SOLN
INTRAMUSCULAR | Status: AC
  Filled 2016-07-16: qty 30

## 2016-07-16 MED ORDER — PROPOFOL 10 MG/ML IV BOLUS
INTRAVENOUS | Status: DC | PRN
  Administered 2016-07-16: 150 mg via INTRAVENOUS

## 2016-07-16 MED ORDER — LACTATED RINGERS IV SOLN: INTRAVENOUS | Status: DC

## 2016-07-16 MED ORDER — MIDAZOLAM HCL 2 MG/2ML IJ SOLN
INTRAMUSCULAR | Status: AC
  Filled 2016-07-16: qty 2

## 2016-07-16 MED ORDER — MEPERIDINE HCL 25 MG/ML IJ SOLN: 6.2500 mg | INTRAMUSCULAR | Status: DC | PRN

## 2016-07-16 MED ORDER — BUPIVACAINE-EPINEPHRINE (PF) 0.25% -1:200000 IJ SOLN
INTRAMUSCULAR | Status: AC
  Filled 2016-07-16: qty 30

## 2016-07-16 MED ORDER — CEFAZOLIN SODIUM-DEXTROSE 2-4 GM/100ML-% IV SOLN
2.0000 g | INTRAVENOUS | Status: AC
  Administered 2016-07-16: 2 g via INTRAVENOUS

## 2016-07-16 MED ORDER — BUPIVACAINE HCL (PF) 0.5 % IJ SOLN
INTRAMUSCULAR | Status: DC | PRN
  Administered 2016-07-16: 10 mL

## 2016-07-16 MED ORDER — FENTANYL CITRATE (PF) 100 MCG/2ML IJ SOLN
50.0000 ug | INTRAMUSCULAR | Status: DC | PRN
  Administered 2016-07-16: 100 ug via INTRAVENOUS

## 2016-07-16 MED ORDER — BUPIVACAINE-EPINEPHRINE (PF) 0.5% -1:200000 IJ SOLN
INTRAMUSCULAR | Status: DC | PRN
  Administered 2016-07-16: 30 mL via PERINEURAL

## 2016-07-16 SURGICAL SUPPLY — 49 items
ADH SKN CLS APL DERMABOND .7 (GAUZE/BANDAGES/DRESSINGS)
BLADE CLIPPER SURG (BLADE) ×2 IMPLANT
BLADE HEX COATED 2.75 (ELECTRODE) ×2 IMPLANT
BLADE SURG 15 STRL LF DISP TIS (BLADE) ×1 IMPLANT
BLADE SURG 15 STRL SS (BLADE) ×2
CANISTER SUCTION 1200CC (MISCELLANEOUS) IMPLANT
CHLORAPREP W/TINT 26ML (MISCELLANEOUS) ×2 IMPLANT
COVER BACK TABLE 60X90IN (DRAPES) ×2 IMPLANT
COVER MAYO STAND STRL (DRAPES) ×2 IMPLANT
DECANTER SPIKE VIAL GLASS SM (MISCELLANEOUS) IMPLANT
DERMABOND ADVANCED (GAUZE/BANDAGES/DRESSINGS)
DERMABOND ADVANCED .7 DNX12 (GAUZE/BANDAGES/DRESSINGS) ×1 IMPLANT
DRAIN PENROSE 1/2X12 LTX STRL (WOUND CARE) ×2 IMPLANT
DRAPE LAPAROTOMY 100X72 PEDS (DRAPES) ×2 IMPLANT
DRAPE UTILITY XL STRL (DRAPES) ×2 IMPLANT
ELECT REM PT RETURN 9FT ADLT (ELECTROSURGICAL) ×2
ELECTRODE REM PT RTRN 9FT ADLT (ELECTROSURGICAL) ×1 IMPLANT
GLOVE BIOGEL PI IND STRL 7.0 (GLOVE) IMPLANT
GLOVE BIOGEL PI INDICATOR 7.0 (GLOVE) ×1
GLOVE ECLIPSE 6.5 STRL STRAW (GLOVE) ×1 IMPLANT
GLOVE EXAM NITRILE EXT CUFF MD (GLOVE) ×1 IMPLANT
GLOVE SURG SIGNA 7.5 PF LTX (GLOVE) ×2 IMPLANT
GOWN STRL REUS W/ TWL LRG LVL3 (GOWN DISPOSABLE) ×1 IMPLANT
GOWN STRL REUS W/ TWL XL LVL3 (GOWN DISPOSABLE) ×1 IMPLANT
GOWN STRL REUS W/TWL LRG LVL3 (GOWN DISPOSABLE) ×2
GOWN STRL REUS W/TWL XL LVL3 (GOWN DISPOSABLE) ×2
LIQUID BAND (GAUZE/BANDAGES/DRESSINGS) ×1 IMPLANT
MESH PARIETEX PROGRIP LEFT (Mesh General) ×1 IMPLANT
NDL HYPO 25X1 1.5 SAFETY (NEEDLE) ×1 IMPLANT
NEEDLE HYPO 25X1 1.5 SAFETY (NEEDLE) ×2 IMPLANT
NS IRRIG 1000ML POUR BTL (IV SOLUTION) ×1 IMPLANT
PACK BASIN DAY SURGERY FS (CUSTOM PROCEDURE TRAY) ×2 IMPLANT
PENCIL BUTTON HOLSTER BLD 10FT (ELECTRODE) ×2 IMPLANT
SLEEVE SCD COMPRESS KNEE MED (MISCELLANEOUS) ×2 IMPLANT
SPONGE INTESTINAL PEANUT (DISPOSABLE) IMPLANT
SPONGE LAP 4X18 X RAY DECT (DISPOSABLE) ×3 IMPLANT
SUT MNCRL AB 4-0 PS2 18 (SUTURE) ×2 IMPLANT
SUT SILK 2 0 SH (SUTURE) ×1 IMPLANT
SUT SURG 0 T 19/GS 22 1969 62 (SUTURE) IMPLANT
SUT VIC AB 2-0 CT1 27 (SUTURE) ×4
SUT VIC AB 2-0 CT1 TAPERPNT 27 (SUTURE) ×2 IMPLANT
SUT VIC AB 3-0 CT1 27 (SUTURE) ×2
SUT VIC AB 3-0 CT1 27XBRD (SUTURE) ×1 IMPLANT
SYR BULB 3OZ (MISCELLANEOUS) IMPLANT
SYR CONTROL 10ML LL (SYRINGE) ×2 IMPLANT
TOWEL OR 17X24 6PK STRL BLUE (TOWEL DISPOSABLE) ×2 IMPLANT
TOWEL OR NON WOVEN STRL DISP B (DISPOSABLE) ×1 IMPLANT
TUBE CONNECTING 20X1/4 (TUBING) IMPLANT
YANKAUER SUCT BULB TIP NO VENT (SUCTIONS) IMPLANT

## 2016-07-16 NOTE — Anesthesia Procedure Notes (Signed)
Procedure Name: Intubation Date/Time: 07/16/2016 11:18 AM Performed by: Curly ShoresRAFT, Alynn Ellithorpe W Pre-anesthesia Checklist: Patient identified, Emergency Drugs available, Suction available and Patient being monitored Patient Re-evaluated:Patient Re-evaluated prior to inductionOxygen Delivery Method: Circle system utilized Preoxygenation: Pre-oxygenation with 100% oxygen Intubation Type: IV induction Ventilation: Mask ventilation without difficulty Laryngoscope Size: Miller and 2 Grade View: Grade II Tube type: Oral Tube size: 8.0 mm Number of attempts: 1 Airway Equipment and Method: Stylet Placement Confirmation: ETT inserted through vocal cords under direct vision,  positive ETCO2 and breath sounds checked- equal and bilateral Secured at: 23 cm Tube secured with: Tape Dental Injury: Teeth and Oropharynx as per pre-operative assessment

## 2016-07-16 NOTE — Op Note (Signed)
LEFT INGUINAL HERNIA REPAIR WITH MESH, INSERTION OF MESH  Procedure Note  Jimmy LinkWilliam S Mcclure 07/16/2016   Pre-op Diagnosis: INCARCERATED LEFT INGUINAL HERNIA     Post-op Diagnosis: same  Procedure(s): LEFT INGUINAL HERNIA REPAIR WITH MESH INSERTION OF MESH (progrip)  Surgeon(s): Abigail Miyamotoouglas Kelda Azad, MD  Anesthesia: General  Staff:  Circulator: Lenn CalPatricia J Campbell, RN Scrub Person: Maryan RuedBrandi C Weaver, RN Circulator Assistant: Randalyn RheaPattie T Caviness, RN  Estimated Blood Loss: Minimal                         [name]   Date: 07/16/2016  Time: 12:14 PM

## 2016-07-16 NOTE — Progress Notes (Signed)
Assisted Dr. Carignan with left, ultrasound guided, femoral block. Side rails up, monitors on throughout procedure. See vital signs in flow sheet. Tolerated Procedure well. 

## 2016-07-16 NOTE — Transfer of Care (Signed)
Immediate Anesthesia Transfer of Care Note  Patient: Jimmy LinkWilliam S Mcclure  Procedure(s) Performed: Procedure(s): LEFT INGUINAL HERNIA REPAIR WITH MESH (Left) INSERTION OF MESH (Left)  Patient Location: PACU  Anesthesia Type:GA combined with regional for post-op pain  Level of Consciousness: awake, sedated and patient cooperative  Airway & Oxygen Therapy: Patient Spontanous Breathing and Patient connected to face mask oxygen  Post-op Assessment: Report given to RN, Post -op Vital signs reviewed and stable and Patient moving all extremities  Post vital signs: Reviewed and stable  Last Vitals:  Vitals:   07/16/16 1030 07/16/16 1045  BP: 135/70 (!) 143/84  Pulse:    Resp: 11 (!) 0  Temp:      Last Pain:  Vitals:   07/16/16 0955  TempSrc: Oral         Complications: No apparent anesthesia complications

## 2016-07-16 NOTE — Discharge Instructions (Signed)
[facility] _______[facility]  UMBILICAL OR INGUINAL HERNIA REPAIR: POST OP INSTRUCTIONS  Always review your discharge instruction sheet given to you by the facility where your surgery was performed. IF YOU HAVE DISABILITY OR FAMILY LEAVE FORMS, YOU MUST BRING THEM TO THE OFFICE FOR PROCESSING.   DO NOT GIVE THEM TO YOUR DOCTOR.  1. A  prescription for pain medication may be given to you upon discharge.  Take your pain medication as prescribed, if needed.  If narcotic pain medicine is not needed, then you may take acetaminophen (Tylenol) or ibuprofen (Advil) as needed. 2. Take your usually prescribed medications unless otherwise directed. 3. If you need a refill on your pain medication, please contact your pharmacy.  They will contact our office to request authorization. Prescriptions will not be filled after 5 pm or on week-ends. 4. You should follow a light diet the first 24 hours after arrival home, such as soup and crackers, etc.  Be sure to include lots of fluids daily.  Resume your normal diet the day after surgery.   Post Anesthesia Home Care Instructions  Activity: Get plenty of rest for the remainder of the day. A responsible adult should stay with you for 24 hours following the procedure.  For the next 24 hours, DO NOT: -Drive a car -Advertising copywriter -Drink alcoholic beverages -Take any medication unless instructed by your physician -Make any legal decisions or sign important papers.  Meals: Start with liquid foods such as gelatin or soup. Progress to regular foods as tolerated. Avoid greasy, spicy, heavy foods. If nausea and/or vomiting occur, drink only clear liquids until the nausea and/or vomiting subsides. Call your physician if vomiting continues.  Special Instructions/Symptoms: Your throat may feel dry or sore from the anesthesia or the breathing tube placed in your throat during surgery. If this causes discomfort, gargle with warm salt water. The discomfort  should disappear within 24 hours.  If you had a scopolamine patch placed behind your ear for the management of post- operative nausea and/or vomiting:  1. The medication in the patch is effective for 72 hours, after which it should be removed.  Wrap patch in a tissue and discard in the trash. Wash hands thoroughly with soap and water. 2. You may remove the patch earlier than 72 hours if you experience unpleasant side effects which may include dry mouth, dizziness or visual disturbances. 3. Avoid touching the patch. Wash your hands with soap and water after contact with the patch.    5. Most patients will experience some swelling and bruising around the umbilicus or in the groin and scrotum.  Ice packs and reclining will help.  Swelling and bruising can take several days to resolve.  6. It is common to experience some constipation if taking pain medication after surgery.  Increasing fluid intake and taking a stool softener (such as Colace) will usually help or prevent this problem from occurring.  A mild laxative (Milk of Magnesia or Miralax) should be taken according to package directions if there are no bowel movements after 48 hours. 7. Unless discharge instructions indicate otherwise, you may remove your bandages 24-48 hours after surgery, and you may shower at that time.  You may have steri-strips (small skin tapes) in place directly over the incision.  These strips should be left on the skin for 7-10 days.  If your surgeon used skin glue on the incision, you may shower in 24 hours.  The glue will flake off over the next 2-3  weeks.  Any sutures or staples will be removed at the office during your follow-up visit. 8. ACTIVITIES:  You may resume regular (light) daily activities beginning the next day--such as daily self-care, walking, climbing stairs--gradually increasing activities as tolerated.  You may have sexual intercourse when it is comfortable.  Refrain from any heavy lifting or straining until  approved by your doctor. a. You may drive when you are no longer taking prescription pain medication, you can comfortably wear a seatbelt, and you can safely maneuver your car and apply brakes. b. RETURN TO WORK:  __________________________________________________________ 9. You should see your doctor in the office for a follow-up appointment approximately 2-3 weeks after your surgery.  Make sure that you call for this appointment within a day or two after you arrive home to insure a convenient appointment time. 10. OTHER INSTRUCTIONS: NO LIFTING MORE THAN 15 POUNDS FOR 4 TO 6 WEEKS 11. ICE PACK AND IBUPROFEN ALSO FOR PAIN 12. OK TO SHOWER TOMORROW __________________________________________________________________________________________________________________________________________________________________________________________  WHEN TO CALL YOUR DOCTOR: 1. Fever over 101.0 2. Inability to urinate 3. Nausea and/or vomiting 4. Extreme swelling or bruising 5. Continued bleeding from incision. 6. Increased pain, redness, or drainage from the incision  The clinic staff is available to answer your questions during regular business hours.  Please dont hesitate to call and ask to speak to one of the nurses for clinical concerns.  If you have a medical emergency, go to the nearest emergency room or call 911.  A surgeon from [facility] is always on call at the hospital   [address], [address], [place]  [address] ?  [address] [address], [place], [place]   [id number] [phone] ? [phone] ? FAX [phone] Web site: [email or web address]

## 2016-07-16 NOTE — Anesthesia Preprocedure Evaluation (Signed)
Anesthesia Evaluation  Patient identified by MRN, date of birth, ID band Patient awake    Reviewed: Allergy & Precautions, NPO status , Patient's Chart, lab work & pertinent test results  Airway Mallampati: II  TM Distance: >3 FB Neck ROM: Full    Dental no notable dental hx.    Pulmonary neg pulmonary ROS,    Pulmonary exam normal breath sounds clear to auscultation       Cardiovascular hypertension, Pt. on medications negative cardio ROS Normal cardiovascular exam Rhythm:Regular Rate:Normal     Neuro/Psych negative neurological ROS  negative psych ROS   GI/Hepatic negative GI ROS, Neg liver ROS,   Endo/Other  negative endocrine ROS  Renal/GU negative Renal ROS  negative genitourinary   Musculoskeletal negative musculoskeletal ROS (+)   Abdominal   Peds negative pediatric ROS (+)  Hematology negative hematology ROS (+)   Anesthesia Other Findings   Reproductive/Obstetrics negative OB ROS                             Anesthesia Physical Anesthesia Plan  ASA: II  Anesthesia Plan: General   Post-op Pain Management: GA combined w/ Regional for post-op pain   Induction: Intravenous  Airway Management Planned: LMA and Oral ETT  Additional Equipment:   Intra-op Plan:   Post-operative Plan: Extubation in OR  Informed Consent: I have reviewed the patients History and Physical, chart, labs and discussed the procedure including the risks, benefits and alternatives for the proposed anesthesia with the patient or authorized representative who has indicated his/her understanding and acceptance.   Dental advisory given  Plan Discussed with: CRNA  Anesthesia Plan Comments:         Anesthesia Quick Evaluation

## 2016-07-16 NOTE — Interval H&P Note (Signed)
History and Physical Interval Note:no change in H and P  07/16/2016 10:52 AM  Jimmy Mcclure  has presented today for surgery, with the diagnosis of INCARCERATED LEFT INGUINAL HERNIA  The various methods of treatment have been discussed with the patient and family. After consideration of risks, benefits and other options for treatment, the patient has consented to  Procedure(s): LEFT INGUINAL HERNIA REPAIR WITH MESH (Left) INSERTION OF MESH (Left) as a surgical intervention .  The patient's history has been reviewed, patient examined, no change in status, stable for surgery.  I have reviewed the patient's chart and labs.  Questions were answered to the patient's satisfaction.     Ellayna Hilligoss A

## 2016-07-16 NOTE — Anesthesia Procedure Notes (Signed)
Anesthesia Regional Block:  Femoral nerve block  Pre-Anesthetic Checklist: ,, timeout performed, Correct Patient, Correct Site, Correct Laterality, Correct Procedure, Correct Position, site marked, Risks and benefits discussed,  Surgical consent,  Pre-op evaluation,  At surgeon's request and post-op pain management  Laterality: Left  Prep: Maximum Sterile Barrier Precautions used, chloraprep       Needles:  Injection technique: Single-shot  Needle Type: Echogenic Stimulator Needle     Needle Length: 10cm 10 cm Needle Gauge: 21 G    Additional Needles:  Procedures: ultrasound guided (picture in chart) Femoral nerve block Narrative:  Injection made incrementally with aspirations every 5 mL.  Performed by: Personally   Additional Notes: Patient tolerated the procedure well without complications

## 2016-07-19 ENCOUNTER — Encounter (HOSPITAL_BASED_OUTPATIENT_CLINIC_OR_DEPARTMENT_OTHER): Payer: Self-pay | Admitting: Surgery

## 2016-07-19 NOTE — Op Note (Signed)
NAME:  Jimmy Mcclure, Jimmy Mcclure NO.:  [id number]  MEDICAL RECORD NO.:  [id number]  LOCATION:                                 FACILITY:  PHYSICIAN:  Abigail Miyamoto, M.D. DATE OF BIRTH:  [birth year]  DATE OF PROCEDURE:  07/16/2016 DATE OF DISCHARGE:                              OPERATIVE REPORT   PREOPERATIVE DIAGNOSIS:  Incarcerated left inguinal hernia.  POSTOPERATIVE DIAGNOSIS:  Incarcerated left inguinal hernia.  PROCEDURE:  Left inguinal hernia repair with mesh.  SURGEON:  Abigail Miyamoto, M.D.  ANESTHESIA:  General with Marcaine and TAP block.  ESTIMATED BLOOD LOSS:  Minimal.  INDICATIONS:  This is a 60 year old gentleman, who has had a known inguinal hernia for many years, it recently became incarcerated.  The decision was made to proceed with urgent repair.  FINDINGS:  The patient was found to have the hernia sac going all the way down to the testicle.  There was no evidence of compromised small bowel, which was easily reduced back into the abdominal cavity.  PROCEDURE IN DETAIL:  The patient was brought to the operating room, identified as Jimmy Mcclure.  He had already had a TAP block provided by Anesthesia.  He was placed supine on the operating room table and general anesthesia was induced.  His abdomen and groin were then prepped and draped in the usual sterile fashion.  I anesthetized the skin in the left lower abdomen with Marcaine.  I then made a longitudinal incision with a scalpel.  I then took this down through Scarpa fascia with electrocautery.  The external oblique fascia and the large incarcerated hernia were identified.  I opened the external oblique muscles going toward the internal and external rings.  It was very difficult to get around the very large thickened hernia sac.  To do this, I finally had to bring the testicle up into the field.  The hernia sac itself went all the way down to the testicle with the hydrocele present.  All  contents were able to be reduced back into the abdominal cavity.  I was able to finally separate the sac, which again was quite thickened from the cord structures.  A small bridging vein had to be tied off with a silk suture.  At this point, I opened up the sac and could visualize the small bowel, later opened the sac, and this appeared viable.  There was also some mild amount of free fluid, which was suctioned free.  I was able to finally reduce all contents back into the abdominal cavity.  I then dissected the sac away from the testicle and the rest of the cord structures.  I then tied off the base of the sac with a 2-0 silk suture and excised the redundant sac.  I then reinforced the inguinal ring with a 2-0 silk suture as well.  At this point, I placed the testicle and cord back down into the scrotum.  I brought a piece of ProGrip Prolene mesh onto the field.  I placed it against the pubic tubercle and sewn in place with a 2-0 Vicryl suture.  I then brought around the cord structures.  Wide coverage of the inguinal  floor and internal ring and cord structures appeared to be achieved.  I then was able to close the external oblique fascia over top of this with a running 2-0 Vicryl suture.  Scarpa fascia was then closed with interrupted 3-0 Vicryl sutures, and the skin was closed with running 4-0 Monocryl.  Skin glue was then applied.  I then examined the scrotum and easily palpated both testicles in the scrotum in the proper location.  The patient tolerated the procedure well.  All sponge, needle, and instrument counts were correct at the end of the procedure.  The patient was then extubated in the operating room and taken in a stable condition to the recovery room.     Abigail Miyamotoouglas Konnar Ben, M.D.   ______________________________ Abigail Miyamotoouglas Tatem Fesler, M.D.    DB/MEDQ  D:  07/16/2016  T:  07/17/2016  Job:  [id number]

## 2016-07-23 NOTE — Anesthesia Postprocedure Evaluation (Signed)
Anesthesia Post Note  Patient: Jimmy LinkWilliam S Mcclure  Procedure(s) Performed: Procedure(s) (LRB): LEFT INGUINAL HERNIA REPAIR WITH MESH (Left) INSERTION OF MESH (Left)  Patient location during evaluation: PACU Anesthesia Type: General Level of consciousness: awake and alert Pain management: pain level controlled Vital Signs Assessment: post-procedure vital signs reviewed and stable Respiratory status: spontaneous breathing, nonlabored ventilation, respiratory function stable and patient connected to nasal cannula oxygen Cardiovascular status: blood pressure returned to baseline and stable Postop Assessment: no signs of nausea or vomiting Anesthetic complications: no    Last Vitals:  Vitals:   07/16/16 1300 07/16/16 1415  BP: 126/74 135/61  Pulse: 76 75  Resp: 12 18  Temp:  36.5 C    Last Pain:  Vitals:   07/19/16 1034  TempSrc:   PainSc: 1                  Phillips Groutarignan, Jaymari Cromie

## 2020-06-11 ENCOUNTER — Other Ambulatory Visit: Payer: Self-pay

## 2020-06-11 ENCOUNTER — Other Ambulatory Visit: Payer: Medicare Other

## 2020-06-11 DIAGNOSIS — Z20822 Contact with and (suspected) exposure to covid-19: Secondary | ICD-10-CM

## 2020-06-12 LAB — NOVEL CORONAVIRUS, NAA: SARS-CoV-2, NAA: NOT DETECTED

## 2020-06-12 LAB — SARS-COV-2, NAA 2 DAY TAT

## 2020-11-05 ENCOUNTER — Other Ambulatory Visit: Payer: Medicare Other

## 2020-11-05 ENCOUNTER — Other Ambulatory Visit: Payer: Self-pay

## 2020-11-05 DIAGNOSIS — Z20822 Contact with and (suspected) exposure to covid-19: Secondary | ICD-10-CM

## 2020-11-08 LAB — NOVEL CORONAVIRUS, NAA: SARS-CoV-2, NAA: NOT DETECTED

## 2022-08-21 ENCOUNTER — Emergency Department (HOSPITAL_COMMUNITY): Payer: Medicare Other

## 2022-08-21 ENCOUNTER — Inpatient Hospital Stay (HOSPITAL_COMMUNITY)
Admission: EM | Admit: 2022-08-21 | Discharge: 2022-08-23 | DRG: 690 | Disposition: A | Discharge status: Home / Self Care | Payer: Medicare Other | Attending: Internal Medicine | Admitting: Internal Medicine

## 2022-08-21 ENCOUNTER — Other Ambulatory Visit: Payer: Self-pay

## 2022-08-21 ENCOUNTER — Encounter (HOSPITAL_COMMUNITY): Payer: Self-pay

## 2022-08-21 DIAGNOSIS — L80 Vitiligo: Secondary | ICD-10-CM | POA: Diagnosis present

## 2022-08-21 DIAGNOSIS — L661 Lichen planopilaris: Secondary | ICD-10-CM | POA: Diagnosis present

## 2022-08-21 DIAGNOSIS — N3001 Acute cystitis with hematuria: Secondary | ICD-10-CM | POA: Diagnosis not present

## 2022-08-21 DIAGNOSIS — I129 Hypertensive chronic kidney disease with stage 1 through stage 4 chronic kidney disease, or unspecified chronic kidney disease: Secondary | ICD-10-CM | POA: Diagnosis present

## 2022-08-21 DIAGNOSIS — Z79899 Other long term (current) drug therapy: Secondary | ICD-10-CM

## 2022-08-21 DIAGNOSIS — E739 Lactose intolerance, unspecified: Secondary | ICD-10-CM | POA: Diagnosis present

## 2022-08-21 DIAGNOSIS — Z8249 Family history of ischemic heart disease and other diseases of the circulatory system: Secondary | ICD-10-CM

## 2022-08-21 DIAGNOSIS — N179 Acute kidney failure, unspecified: Secondary | ICD-10-CM | POA: Diagnosis present

## 2022-08-21 DIAGNOSIS — N400 Enlarged prostate without lower urinary tract symptoms: Secondary | ICD-10-CM | POA: Diagnosis present

## 2022-08-21 DIAGNOSIS — N182 Chronic kidney disease, stage 2 (mild): Secondary | ICD-10-CM | POA: Diagnosis present

## 2022-08-21 DIAGNOSIS — I861 Scrotal varices: Secondary | ICD-10-CM | POA: Diagnosis present

## 2022-08-21 DIAGNOSIS — H409 Unspecified glaucoma: Secondary | ICD-10-CM | POA: Diagnosis present

## 2022-08-21 DIAGNOSIS — Z87440 Personal history of urinary (tract) infections: Secondary | ICD-10-CM

## 2022-08-21 DIAGNOSIS — N431 Infected hydrocele: Secondary | ICD-10-CM | POA: Diagnosis not present

## 2022-08-21 DIAGNOSIS — E876 Hypokalemia: Secondary | ICD-10-CM | POA: Diagnosis present

## 2022-08-21 DIAGNOSIS — N452 Orchitis: Secondary | ICD-10-CM | POA: Diagnosis present

## 2022-08-21 LAB — URINALYSIS, ROUTINE W REFLEX MICROSCOPIC
Bilirubin Urine: NEGATIVE
Glucose, UA: NEGATIVE mg/dL
Ketones, ur: NEGATIVE mg/dL
Nitrite: POSITIVE — AB
Protein, ur: 30 mg/dL — AB
Specific Gravity, Urine: 1.017 (ref 1.005–1.030)
pH: 6 (ref 5.0–8.0)

## 2022-08-21 LAB — CBC WITH DIFFERENTIAL/PLATELET
Abs Immature Granulocytes: 0.04 10*3/uL (ref 0.00–0.07)
Basophils Absolute: 0 10*3/uL (ref 0.0–0.1)
Basophils Relative: 0 %
Eosinophils Absolute: 0.1 10*3/uL (ref 0.0–0.5)
Eosinophils Relative: 0 %
HCT: 39.2 % (ref 39.0–52.0)
Hemoglobin: 12.7 g/dL — ABNORMAL LOW (ref 13.0–17.0)
Immature Granulocytes: 0 %
Lymphocytes Relative: 7 %
Lymphs Abs: 0.9 10*3/uL (ref 0.7–4.0)
MCH: 26.3 pg (ref 26.0–34.0)
MCHC: 32.4 g/dL (ref 30.0–36.0)
MCV: 81.2 fL (ref 80.0–100.0)
Monocytes Absolute: 1.1 10*3/uL — ABNORMAL HIGH (ref 0.1–1.0)
Monocytes Relative: 9 %
Neutro Abs: 10 10*3/uL — ABNORMAL HIGH (ref 1.7–7.7)
Neutrophils Relative %: 84 %
Platelets: 255 10*3/uL (ref 150–400)
RBC: 4.83 MIL/uL (ref 4.22–5.81)
RDW: 14.2 % (ref 11.5–15.5)
WBC: 12.1 10*3/uL — ABNORMAL HIGH (ref 4.0–10.5)
nRBC: 0 % (ref 0.0–0.2)

## 2022-08-21 LAB — BASIC METABOLIC PANEL
Anion gap: 10 (ref 5–15)
BUN: 21 mg/dL (ref 8–23)
CO2: 29 mmol/L (ref 22–32)
Calcium: 9.6 mg/dL (ref 8.9–10.3)
Chloride: 102 mmol/L (ref 98–111)
Creatinine, Ser: 1.69 mg/dL — ABNORMAL HIGH (ref 0.61–1.24)
GFR, Estimated: 44 mL/min — ABNORMAL LOW (ref >60)
Glucose, Bld: 113 mg/dL — ABNORMAL HIGH (ref 70–99)
Potassium: 2.8 mmol/L — ABNORMAL LOW (ref 3.5–5.1)
Sodium: 141 mmol/L (ref 135–145)

## 2022-08-21 LAB — LACTIC ACID, PLASMA: Lactic Acid, Venous: 0.8 mmol/L (ref 0.5–1.9)

## 2022-08-21 LAB — MAGNESIUM: Magnesium: 2.3 mg/dL (ref 1.7–2.4)

## 2022-08-21 MED ORDER — IOHEXOL 300 MG/ML  SOLN
100.0000 mL | Freq: Once | INTRAMUSCULAR | Status: AC | PRN
  Administered 2022-08-21: 100 mL via INTRAVENOUS

## 2022-08-21 MED ORDER — POTASSIUM CHLORIDE 2 MEQ/ML IV SOLN
INTRAVENOUS | Status: AC
  Filled 2022-08-21 (×2): qty 1000

## 2022-08-21 MED ORDER — AMLODIPINE BESYLATE 10 MG PO TABS
10.0000 mg | ORAL_TABLET | Freq: Every day | ORAL | Status: DC
  Administered 2022-08-22 – 2022-08-23 (×2): 10 mg via ORAL
  Filled 2022-08-21 (×2): qty 1

## 2022-08-21 MED ORDER — CEFTRIAXONE SODIUM 1 G IJ SOLR
1.0000 g | Freq: Once | INTRAMUSCULAR | Status: AC
  Administered 2022-08-21: 1 g via INTRAVENOUS
  Filled 2022-08-21: qty 10

## 2022-08-21 MED ORDER — POTASSIUM CHLORIDE 10 MEQ/100ML IV SOLN
10.0000 meq | INTRAVENOUS | Status: AC
  Administered 2022-08-21 (×3): 10 meq via INTRAVENOUS
  Filled 2022-08-21 (×2): qty 100

## 2022-08-21 MED ORDER — ONDANSETRON HCL 4 MG/2ML IJ SOLN: 4.0000 mg | Freq: Three times a day (TID) | INTRAMUSCULAR | Status: DC | PRN

## 2022-08-21 MED ORDER — SODIUM CHLORIDE 0.9 % IV SOLN
2.0000 g | INTRAVENOUS | Status: DC
  Administered 2022-08-22: 2 g via INTRAVENOUS
  Filled 2022-08-21: qty 20

## 2022-08-21 MED ORDER — FENTANYL CITRATE PF 50 MCG/ML IJ SOSY
50.0000 ug | PREFILLED_SYRINGE | INTRAMUSCULAR | Status: DC | PRN
  Administered 2022-08-21: 50 ug via INTRAVENOUS
  Filled 2022-08-21: qty 1

## 2022-08-21 MED ORDER — POTASSIUM CHLORIDE CRYS ER 20 MEQ PO TBCR
40.0000 meq | EXTENDED_RELEASE_TABLET | Freq: Once | ORAL | Status: AC
  Administered 2022-08-21: 40 meq via ORAL
  Filled 2022-08-21: qty 2

## 2022-08-21 MED ORDER — LACTATED RINGERS IV BOLUS
1000.0000 mL | Freq: Once | INTRAVENOUS | Status: AC
  Administered 2022-08-21: 1000 mL via INTRAVENOUS

## 2022-08-21 NOTE — ED Triage Notes (Signed)
Patient is having right sided groin pain along with both testicles swelling since yesterday. No burning with urination.

## 2022-08-21 NOTE — ED Provider Notes (Signed)
[facility] DEPT Provider Note   CSN: [id number] Arrival date & time: 08/21/22  1352     History  Chief Complaint  Patient presents with  . Groin Pain    Jimmy Mcclure is a 66 y.o. male.   Groin Pain    66 year old male with medical history significant for HTN, incarcerated left inguinal hernia status postsurgical repair who presents to the emergency department with right groin pain and swelling for the past 2 days.  The patient states that he noticed swelling in his bilateral testicles starting yesterday.  He denies any burning while urinating or increased urinary frequency.  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  He endorses worsening swelling in that area.  He states that it feels very similar to the last time he had an incarcerated hernia in his left groin.  He denies any hematuria, penile discharge, he denies any nausea or vomiting.  He denies any abdominal pain.  All of his pain  focused in the right scrotum, worse with movement and bending over and bearing down.  Denies any fevers or chills.  Home Medications Prior to Admission medications   Medication Sig Start Date End Date Taking? Authorizing Provider  amLODipine (NORVASC) 10 MG tablet Take 10 mg by mouth daily.    [provider]  brimonidine-timolol (COMBIGAN) 0.2-0.5 % ophthalmic solution Place 1 drop into both eyes every 12 (twelve) hours.    [provider]  dorzolamide (TRUSOPT) 2 % ophthalmic solution Place 1 drop into both eyes 3 (three) times daily.     [provider]  HYDROcodone-acetaminophen (NORCO/VICODIN) 5-325 MG tablet Take 1-2 tablets by mouth every 4 (four) hours as needed for moderate pain or severe pain. 07/16/16   Coralie Keens, MD  sulfamethoxazole-trimethoprim (BACTRIM DS,SEPTRA DS) 800-160 MG tablet Take  1 tablet by mouth 2 (two) times daily. 07/11/16   [provider]  Travoprost, BAK Free, (TRAVATAN) 0.004 % SOLN ophthalmic solution Place 1 drop into both eyes at bedtime.    [provider]  triamterene-hydrochlorothiazide (DYAZIDE) 37.5-25 MG per capsule Take 1 capsule by mouth every morning.    [provider]      Allergies    Lactose intolerance (gi)    Review of Systems   Review of Systems  All other systems reviewed and are negative.   Physical Exam Updated Vital Signs BP (!) 146/76   Pulse 97   Temp 99.8 F (37.7 C) (Oral)   Resp 15   Ht 5\' 10"  (1.778 m)   Wt 97.1 kg   SpO2 100%   BMI 30.71 kg/m  Physical Exam Vitals and nursing note reviewed.  Constitutional:      General: He is not in acute distress.    Appearance: He is well-developed.  HENT:     Head: Normocephalic and atraumatic.  Eyes:     Conjunctiva/sclera: Conjunctivae normal.  Cardiovascular:     Rate and Rhythm: Normal rate and regular rhythm.  Pulmonary:     Effort: Pulmonary effort is normal. No respiratory distress.     Breath sounds: Normal breath sounds.  Abdominal:     Palpations: Abdomen is soft.     Tenderness: There is no abdominal tenderness.  Musculoskeletal:        General: No swelling.     Cervical back: Neck supple.  Skin:  General: Skin is warm and dry.     Capillary Refill: Capillary refill takes less than 2 seconds.  Neurological:     Mental Status: He is alert.  Psychiatric:        Mood and Affect: Mood normal.   ED Results / Procedures / Treatments   Labs (all labs ordered are listed, but only abnormal results are displayed) Labs Reviewed  BASIC METABOLIC PANEL - Abnormal; Notable for the following components:      Result Value   Potassium 2.8 (*)    Glucose, Bld 113 (*)    Creatinine, Ser 1.69 (*)    GFR, Estimated 44 (*)    All other components within normal limits  CBC WITH DIFFERENTIAL/PLATELET - Abnormal; Notable for the following  components:   WBC 12.1 (*)    Hemoglobin 12.7 (*)    Neutro Abs 10.0 (*)    Monocytes Absolute 1.1 (*)    All other components within normal limits  URINALYSIS, ROUTINE W REFLEX MICROSCOPIC - Abnormal; Notable for the following components:   APPearance HAZY (*)    Hgb urine dipstick SMALL (*)    Protein, ur 30 (*)    Nitrite POSITIVE (*)    Leukocytes,Ua MODERATE (*)    Bacteria, UA RARE (*)    All other components within normal limits    EKG None  Radiology No results found.  Procedures Procedures  {Document cardiac monitor, telemetry assessment procedure when appropriate:1}  Medications Ordered in ED Medications - No data to display  ED Course/ Medical Decision Making/ A&P                           Medical Decision Making  ***  {Document critical care time when appropriate:1} {Document review of labs and clinical decision tools ie heart score, Chads2Vasc2 etc:1}  {Document your independent review of radiology images, and any outside records:1} {Document your discussion with family members, caretakers, and with consultants:1} {Document social determinants of health affecting pt's care:1} {Document your decision making why or why not admission, treatments were needed:1} Final Clinical Impression(s) / ED Diagnoses Final diagnoses:  None    Rx / DC Orders ED Discharge Orders     None

## 2022-08-21 NOTE — ED Provider Triage Note (Signed)
Emergency Medicine Provider Triage Evaluation Note  Jimmy Mcclure , a 66 y.o. male  was evaluated in triage.  Pt complains of right-sided groin pain onset yesterday.  No recent injury, trauma, fall, heavy lifting.  Notes that his pain radiates to his bilateral testicles with swelling.  Notes that his symptoms are worsened with heavy lifting.  No meds tried prior to arrival.  Denies dysuria, hematuria, penile pain/swelling/discharge, nausea, vomiting.  Has a history of hernia in 2011 on the left side and notes that his symptoms today feel similar.  Review of Systems  Positive:  Negative:   Physical Exam  BP (!) 170/77 (BP Location: Left Arm)   Pulse 96   Temp 99.8 F (37.7 C) (Oral)   Resp 15   Ht 5\' 10"  (1.778 m)   Wt 97.1 kg   SpO2 100%   BMI 30.71 kg/m  Gen:   Awake, no distress   Resp:  Normal effort  MSK:   Moves extremities without difficulty  Other:  No abdominal tenderness to palpation.  GU exam deferred in triage.  Medical Decision Making  Medically screening exam initiated at 2:05 PM.  Appropriate orders placed.  Theodis Aguas was informed that the remainder of the evaluation will be completed by another provider, this initial triage assessment does not replace that evaluation, and the importance of remaining in the ED until their evaluation is complete.  Work-up initiated   Olamide Lahaie A, PA-C 08/21/22 1418

## 2022-08-21 NOTE — H&P (Signed)
History and Physical  Jimmy Mcclure HGD:[id number] DOB: 10/11/1956 DOA: 08/21/2022  Referring physician: Dr Armandina Gemma, [place]  PCP: Vincente Liberty, MD  Outpatient Specialists: Dermatology, general surgery. Patient coming from: Home  Chief Complaint: R groin pain   HPI: Jimmy Mcclure is a 66 y.o. male with medical history significant for vitiligo, lichen planopilaris, history of left inguinal hernia status postrepair in 2017, who presented to [facility] ED with complaints of right-sided groin pain for the past 2 days.  Associated with testicular swelling.  No reported subjective fevers or chills.  Denies dysuria.  He presented to the ED for further evaluation.  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  Worse with straining or bending down.  Symptoms are similar to prior incarcerated left hernia.  Denies any abdominal pain.  In the ED, the patient had a ultrasound of his scrotum which showed possible right orchitis, correlation with clinical exam and urinalysis is recommended.  Moderate minimal complex right hydrocele.  Mild left varicocele.  The patient was started on IV antibiotics empirically, Rocephin.  He also received 1 L IV fluid bolus LR in the ED.  Symptoms improved in the ED.  TRH, hospitalist service, was asked to admit.  ED Course: Tmax 99.8.  BP 132/65, pulse 89, respiration rate 18, O2 saturation 100% on room air.  Lab studies remarkable for serum potassium 2.8, glucose 113, creatinine 1.69 from baseline of 1.33, GFR 44.  Lactic acid 0.8.  WBC 12.1, hemoglobin 12.7, neutrophil count 10.0.  UA positive for pyuria.  Review of Systems: Review of systems as noted in the HPI. All other systems reviewed and are negative.   Past Medical History:  Diagnosis Date   Enlarged prostate    Glaucoma    Heart murmur    states "flow heart murmur" since childhood; no known problems, no cardiologist   Hypertension     states under control with meds., has been on med. x 5 yr.   Incarcerated left inguinal hernia 06/2016   Seasonal allergies    Urinary tract infection 07/11/2016   started antibiotic 07/11/2016   Past Surgical History:  Procedure Laterality Date   [place] Left 07/16/2016   Procedure: LEFT INGUINAL HERNIA REPAIR WITH MESH;  Surgeon: Coralie Keens, MD;  Location: [place];  Service: General;  Laterality: Left;   INSERTION OF MESH Left 07/16/2016   Procedure: INSERTION OF MESH;  Surgeon: Coralie Keens, MD;  Location: [place];  Service: General;  Laterality: Left;   MULTIPLE TOOTH EXTRACTIONS      Social History:  reports that he has never smoked. He has never used smokeless tobacco. He reports that he does not drink alcohol and does not use drugs.   Allergies  Allergen Reactions   Lactose Intolerance (Gi) Other (See Comments)    GI UPSET    Family History  Problem Relation Age of Onset   Cancer Mother        lung ca   Heart disease Father    Dementia Father    Diabetes Father    Hypertension Sister    Crohn's disease Daughter       Prior to Admission medications   Medication Sig Start Date End Date Taking? Authorizing Provider  amLODipine (NORVASC) 10 MG tablet Take 10 mg by mouth daily.    [provider]  brimonidine-timolol (COMBIGAN)  0.2-0.5 % ophthalmic solution Place 1 drop into both eyes every 12 (twelve) hours.    [provider]  dorzolamide (TRUSOPT) 2 % ophthalmic solution Place 1 drop into both eyes 3 (three) times daily.     [provider]  HYDROcodone-acetaminophen (NORCO/VICODIN) 5-325 MG tablet Take 1-2 tablets by mouth every 4 (four) hours as needed for moderate pain or severe pain. 07/16/16   Coralie Keens, MD  sulfamethoxazole-trimethoprim (BACTRIM DS,SEPTRA DS) 800-160 MG tablet Take 1 tablet by mouth 2 (two) times daily. 07/11/16   [provider]  Travoprost, BAK Free, (TRAVATAN) 0.004 % SOLN ophthalmic solution Place 1 drop into both eyes at bedtime.    [provider]  triamterene-hydrochlorothiazide (DYAZIDE) 37.5-25 MG per capsule Take 1 capsule by mouth every morning.    [provider]    Physical Exam: BP 132/65   Pulse 89   Temp 98.8 F (37.1 C) (Oral)   Resp 18   Ht 5\' 10"  (1.778 m)   Wt 97.1 kg   SpO2 100%   BMI 30.71 kg/m   General: 66 y.o. year-old male well developed well nourished in no acute distress.  Alert and oriented x3. Cardiovascular: Regular rate and rhythm with no rubs or gallops.  No thyromegaly or JVD noted.  No lower extremity edema. 2/4 pulses in all 4 extremities. Respiratory: Clear to auscultation with no wheezes or rales. Good inspiratory effort. Abdomen: Soft nontender nondistended with normal bowel sounds x4 quadrants. Muskuloskeletal: No cyanosis, clubbing or edema noted bilaterally Neuro: CN II-XII intact, strength, sensation, reflexes Skin: No ulcerative lesions noted or rashes Psychiatry: Judgement and insight appear normal. Mood is appropriate for condition and setting          Labs on Admission:  Basic Metabolic Panel: Recent Labs  Lab 08/21/22 1512 08/21/22 2049  NA 141  --   K 2.8*  --   CL 102  --   CO2 29  --   GLUCOSE 113*  --   BUN 21  --   CREATININE 1.69*  --   CALCIUM 9.6  --   MG  --  2.3   Liver Function Tests: No results for input(s): "AST", "ALT", "ALKPHOS", "BILITOT", "PROT", "ALBUMIN" in the last 168 hours. No results for input(s): "LIPASE", "AMYLASE" in the last 168 hours. No results for input(s): "AMMONIA" in the last 168 hours. CBC: Recent Labs  Lab 08/21/22 1512  WBC 12.1*  NEUTROABS 10.0*  HGB 12.7*  HCT 39.2  MCV 81.2  PLT 255   Cardiac Enzymes: No results for input(s): "CKTOTAL", "CKMB", "CKMBINDEX", "TROPONINI" in the last 168 hours.  BNP (last 3 results) No results for input(s): "BNP" in the last 8760  hours.  ProBNP (last 3 results) No results for input(s): "PROBNP" in the last 8760 hours.  CBG: No results for input(s): "GLUCAP" in the last 168 hours.  Radiological Exams on Admission: US SCROTUM W/DOPPLER  Result Date: 08/21/2022 CLINICAL DATA:  Scrotal swelling. EXAM: SCROTAL ULTRASOUND DOPPLER ULTRASOUND OF THE TESTICLES TECHNIQUE: Complete ultrasound examination of the testicles, epididymis, and other scrotal structures was performed. Color and spectral Doppler ultrasound were also utilized to evaluate blood flow to the testicles. COMPARISON:  CT abdomen pelvis dated 08/21/2022. FINDINGS: Right testicle Measurements: 4.5 x 3.6 x 3.4 cm. The right testicle appears hyperemic. Clinical correlation is recommended to evaluate for possibility of orchitis. Left testicle Measurements: 3.4 x 1.5 x 2.4 cm. The left testicle is slightly heterogeneous. No focal mass. Right epididymis:  Normal in size and appearance. Left epididymis:  Normal in size and appearance. Hydrocele: Right-sided hydrocele measuring 8.8 x 5.7 x 7.2 cm. There is floating low-level echogenic debris within the right hydrocele. Varicocele:  Mild left varicocele. Pulsed Doppler interrogation of both testes demonstrates normal low resistance arterial and venous waveforms bilaterally. IMPRESSION: 1. Possible right orchitis. Correlation with clinical exam and urinalysis recommended. 2. Moderate minimally complex right hydrocele. 3. Mild left varicocele. Electronically Signed   By: Anner Crete M.D.   On: 08/21/2022 23:14   CT ABDOMEN PELVIS W CONTRAST  Result Date: 08/21/2022 CLINICAL DATA:  Inguinal hernia, right groin pain. EXAM: CT ABDOMEN AND PELVIS WITH CONTRAST TECHNIQUE: Multidetector CT imaging of the abdomen and pelvis was performed using the standard protocol following bolus administration of intravenous contrast. RADIATION DOSE REDUCTION: This exam was performed according to the departmental dose-optimization program which  includes automated exposure control, adjustment of the mA and/or kV according to patient size and/or use of iterative reconstruction technique. CONTRAST:  146mL OMNIPAQUE IOHEXOL 300 MG/ML  SOLN COMPARISON:  None Available. FINDINGS: Lower chest: No acute abnormality. Hepatobiliary: No focal liver abnormality is seen. No gallstones, gallbladder wall thickening, or biliary dilatation. Pancreas: Unremarkable. No pancreatic ductal dilatation or surrounding inflammatory changes. Spleen: Normal in size without focal abnormality. Adrenals/Urinary Tract: Adrenal glands are unremarkable. Kidneys are normal, without renal calculi, focal lesion, or hydronephrosis. Bladder is unremarkable. Stomach/Bowel: Stomach is within normal limits. Appendix appears normal. No evidence of bowel wall thickening, distention, or inflammatory changes. Vascular/Lymphatic: No significant vascular findings are present. No enlarged abdominal or pelvic lymph nodes. Reproductive: Prostate is unremarkable. Other: No abdominal wall hernia or abnormality. No abdominopelvic ascites. Large right hydrocele present. Musculoskeletal: No acute or significant osseous findings. IMPRESSION: 1. Large right hydrocele. 2. No evidence of inguinal hernia. 3. No acute abnormality of the abdomen or pelvis. Electronically Signed   By: Ronney Asters M.D.   On: 08/21/2022 21:40    EKG: I independently viewed the EKG done and my findings are as followed: None available at the time of this visit.  Assessment/Plan Present on Admission:  Acute cystitis with hematuria  Principal Problem:   Acute cystitis with hematuria  Acute cystitis with hematuria, POA Started on Rocephin empirically Monitor urine culture Monitor fever curve and WBC. IV fluid hydration, LR KCl 40 mEq at 75 cc/h x 1 day.  Hypokalemia Serum potassium 2.8 Repleted intravenously and orally Magnesium level 2.3 Repeat chemistry panel in the morning.  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 Presented with creatinine of 1.69 with GFR 44 Avoid nephrotoxic agents, dehydration and hypotension. Monitor urine output with strict I's and O's. Repeat renal function test in the morning.    DVT prophylaxis: SCDs, chemical DVT prophylaxis held due to hematuria.  Code Status: Full code  Family Communication: None at bedside  Disposition Plan: Admitted to telemetry unit  Consults called: None.  Admission status: Observation status.   Status is: Observation    Kayleen Memos MD [facility] Pager [phone]  If 7PM-7AM, please contact night-coverage [email or web address] Password [email or web address]  08/21/2022, 11:18 PM

## 2022-08-22 DIAGNOSIS — L661 Lichen planopilaris: Secondary | ICD-10-CM | POA: Diagnosis present

## 2022-08-22 DIAGNOSIS — N3001 Acute cystitis with hematuria: Secondary | ICD-10-CM | POA: Diagnosis present

## 2022-08-22 DIAGNOSIS — N182 Chronic kidney disease, stage 2 (mild): Secondary | ICD-10-CM | POA: Diagnosis present

## 2022-08-22 DIAGNOSIS — E876 Hypokalemia: Secondary | ICD-10-CM | POA: Diagnosis present

## 2022-08-22 DIAGNOSIS — Z8249 Family history of ischemic heart disease and other diseases of the circulatory system: Secondary | ICD-10-CM | POA: Diagnosis not present

## 2022-08-22 DIAGNOSIS — N431 Infected hydrocele: Secondary | ICD-10-CM | POA: Diagnosis present

## 2022-08-22 DIAGNOSIS — Z79899 Other long term (current) drug therapy: Secondary | ICD-10-CM | POA: Diagnosis not present

## 2022-08-22 DIAGNOSIS — L80 Vitiligo: Secondary | ICD-10-CM | POA: Diagnosis present

## 2022-08-22 DIAGNOSIS — I861 Scrotal varices: Secondary | ICD-10-CM | POA: Diagnosis present

## 2022-08-22 DIAGNOSIS — N452 Orchitis: Secondary | ICD-10-CM | POA: Diagnosis present

## 2022-08-22 DIAGNOSIS — I129 Hypertensive chronic kidney disease with stage 1 through stage 4 chronic kidney disease, or unspecified chronic kidney disease: Secondary | ICD-10-CM | POA: Diagnosis present

## 2022-08-22 DIAGNOSIS — H409 Unspecified glaucoma: Secondary | ICD-10-CM | POA: Diagnosis present

## 2022-08-22 DIAGNOSIS — N400 Enlarged prostate without lower urinary tract symptoms: Secondary | ICD-10-CM | POA: Diagnosis present

## 2022-08-22 DIAGNOSIS — N179 Acute kidney failure, unspecified: Secondary | ICD-10-CM | POA: Diagnosis present

## 2022-08-22 DIAGNOSIS — Z87440 Personal history of urinary (tract) infections: Secondary | ICD-10-CM | POA: Diagnosis not present

## 2022-08-22 DIAGNOSIS — E739 Lactose intolerance, unspecified: Secondary | ICD-10-CM | POA: Diagnosis present

## 2022-08-22 LAB — COMPREHENSIVE METABOLIC PANEL
ALT: 15 U/L (ref 0–44)
AST: 14 U/L — ABNORMAL LOW (ref 15–41)
Albumin: 3.4 g/dL — ABNORMAL LOW (ref 3.5–5.0)
Alkaline Phosphatase: 47 U/L (ref 38–126)
Anion gap: 6 (ref 5–15)
BUN: 22 mg/dL (ref 8–23)
CO2: 28 mmol/L (ref 22–32)
Calcium: 8.9 mg/dL (ref 8.9–10.3)
Chloride: 105 mmol/L (ref 98–111)
Creatinine, Ser: 1.37 mg/dL — ABNORMAL HIGH (ref 0.61–1.24)
GFR, Estimated: 56 mL/min — ABNORMAL LOW (ref >60)
Glucose, Bld: 109 mg/dL — ABNORMAL HIGH (ref 70–99)
Potassium: 3.3 mmol/L — ABNORMAL LOW (ref 3.5–5.1)
Sodium: 139 mmol/L (ref 135–145)
Total Bilirubin: 0.9 mg/dL (ref 0.3–1.2)
Total Protein: 7.4 g/dL (ref 6.5–8.1)

## 2022-08-22 LAB — CBC WITH DIFFERENTIAL/PLATELET
Abs Immature Granulocytes: 0.03 10*3/uL (ref 0.00–0.07)
Basophils Absolute: 0 10*3/uL (ref 0.0–0.1)
Basophils Relative: 0 %
Eosinophils Absolute: 0.1 10*3/uL (ref 0.0–0.5)
Eosinophils Relative: 1 %
HCT: 35.4 % — ABNORMAL LOW (ref 39.0–52.0)
Hemoglobin: 11.6 g/dL — ABNORMAL LOW (ref 13.0–17.0)
Immature Granulocytes: 0 %
Lymphocytes Relative: 16 %
Lymphs Abs: 1.4 10*3/uL (ref 0.7–4.0)
MCH: 26.4 pg (ref 26.0–34.0)
MCHC: 32.8 g/dL (ref 30.0–36.0)
MCV: 80.5 fL (ref 80.0–100.0)
Monocytes Absolute: 0.9 10*3/uL (ref 0.1–1.0)
Monocytes Relative: 11 %
Neutro Abs: 6.2 10*3/uL (ref 1.7–7.7)
Neutrophils Relative %: 72 %
Platelets: 243 10*3/uL (ref 150–400)
RBC: 4.4 MIL/uL (ref 4.22–5.81)
RDW: 14.3 % (ref 11.5–15.5)
WBC: 8.7 10*3/uL (ref 4.0–10.5)
nRBC: 0 % (ref 0.0–0.2)

## 2022-08-22 LAB — HIV ANTIBODY (ROUTINE TESTING W REFLEX): HIV Screen 4th Generation wRfx: NONREACTIVE

## 2022-08-22 LAB — MAGNESIUM: Magnesium: 2.4 mg/dL (ref 1.7–2.4)

## 2022-08-22 LAB — PHOSPHORUS: Phosphorus: 3.3 mg/dL (ref 2.5–4.6)

## 2022-08-22 MED ORDER — POLYETHYLENE GLYCOL 3350 17 G PO PACK: 17.0000 g | PACK | Freq: Every day | ORAL | Status: DC | PRN

## 2022-08-22 MED ORDER — ORAL CARE MOUTH RINSE: 15.0000 mL | OROMUCOSAL | Status: DC | PRN

## 2022-08-22 MED ORDER — BRIMONIDINE TARTRATE 0.15 % OP SOLN
1.0000 [drp] | Freq: Three times a day (TID) | OPHTHALMIC | Status: DC
  Administered 2022-08-22 – 2022-08-23 (×4): 1 [drp] via OPHTHALMIC
  Filled 2022-08-22: qty 5

## 2022-08-22 MED ORDER — MELATONIN 5 MG PO TABS: 5.0000 mg | ORAL_TABLET | Freq: Every evening | ORAL | Status: DC | PRN

## 2022-08-22 MED ORDER — SENNOSIDES-DOCUSATE SODIUM 8.6-50 MG PO TABS
1.0000 | ORAL_TABLET | Freq: Every day | ORAL | Status: DC
  Filled 2022-08-22: qty 1

## 2022-08-22 MED ORDER — DORZOLAMIDE HCL 2 % OP SOLN
1.0000 [drp] | Freq: Three times a day (TID) | OPHTHALMIC | Status: DC
  Administered 2022-08-22 – 2022-08-23 (×4): 1 [drp] via OPHTHALMIC
  Filled 2022-08-22: qty 10

## 2022-08-22 MED ORDER — POTASSIUM CHLORIDE CRYS ER 20 MEQ PO TBCR
40.0000 meq | EXTENDED_RELEASE_TABLET | Freq: Once | ORAL | Status: AC
  Administered 2022-08-22: 40 meq via ORAL
  Filled 2022-08-22: qty 2

## 2022-08-22 MED ORDER — TIMOLOL MALEATE 0.5 % OP SOLN
1.0000 [drp] | Freq: Two times a day (BID) | OPHTHALMIC | Status: DC
  Administered 2022-08-22 (×2): 1 [drp] via OPHTHALMIC
  Filled 2022-08-22: qty 5

## 2022-08-22 MED ORDER — ACETAMINOPHEN 325 MG PO TABS: 650.0000 mg | ORAL_TABLET | Freq: Four times a day (QID) | ORAL | Status: DC | PRN

## 2022-08-22 MED ORDER — LATANOPROST 0.005 % OP SOLN
1.0000 [drp] | Freq: Every day | OPHTHALMIC | Status: DC
  Administered 2022-08-22: 1 [drp] via OPHTHALMIC
  Filled 2022-08-22: qty 2.5

## 2022-08-22 MED ORDER — HYDROMORPHONE HCL 1 MG/ML IJ SOLN: 0.5000 mg | INTRAMUSCULAR | Status: DC | PRN

## 2022-08-22 MED ORDER — OXYCODONE HCL 5 MG PO TABS: 5.0000 mg | ORAL_TABLET | Freq: Four times a day (QID) | ORAL | Status: DC | PRN

## 2022-08-22 NOTE — Progress Notes (Signed)
  Progress Note   Patient: Jimmy Mcclure DJS:[id number] DOB: 1956-07-25 DOA: 08/21/2022     0 DOS: the patient was seen and examined on 08/22/2022   Brief hospital course: 66 y.o. male with medical history significant for vitiligo, lichen planopilaris, history of left inguinal hernia status post repair in 2017, who presented t with complaints of right-sided groin pain for the past 2 days.  Urinalysis was concerning for urinary tract infection, serum creatinine was elevated to 1.6. he is on Rocephin IV fluids.  Assessment and Plan: Present on Admission:  Acute cystitis with hematuria   Principal Problem:   Acute cystitis with hematuria   Acute cystitis with mild hematuria, POA Continue Rocephin empirically Monitor urine culture for ID and sensitivity, narrow down antibiotics when able. Monitor fever curve and WBC. IV fluid hydration, LR KCl 40 mEq at 75 cc/h    Hypokalemia Serum potassium improved to 3.3 Repleted intravenously and orally Magnesium level 2.3 Repeat chemistry panel in the morning.   AKI on CKD 2A, suspect prerenal in the setting of dehydration Baseline creatinine appears to be 1.33 with GFR of 66. Presented with creatinine of 1.69 with GFR 44 now Scr improved to 1.37 Avoid nephrotoxic agents, dehydration and hypotension. Monitor urine output with strict I's and O's. Repeat renal function test in the morning.         Subjective: Notes improvement with his right groin pain  Physical Exam:Physical Exam Constitutional:      Appearance: Normal appearance.  HENT:     Head: Normocephalic and atraumatic.     Nose: Nose normal.     Mouth/Throat:     Mouth: Mucous membranes are moist.  Eyes:     Extraocular Movements: Extraocular movements intact.     Pupils: Pupils are equal, round, and reactive to light.  Cardiovascular:     Rate and Rhythm: Normal rate and regular rhythm.     Pulses: Normal pulses.     Heart sounds: Normal heart sounds.  Pulmonary:      Effort: Pulmonary effort is normal.     Breath sounds: Normal breath sounds.  Abdominal:     General: Bowel sounds are normal.  Musculoskeletal:        General: Normal range of motion.     Cervical back: Normal range of motion.  Skin:    General: Skin is warm.     Capillary Refill: Capillary refill takes less than 2 seconds.  Neurological:     General: No focal deficit present.     Mental Status: He is alert.  Psychiatric:        Mood and Affect: Mood normal.      Vitals:   08/22/22 0128 08/22/22 0611 08/22/22 0910 08/22/22 1206  BP: 128/73 120/71 120/66 122/72  Pulse: 75 68 67 63  Resp: 18 18 16 16   Temp: 98 F (36.7 C) 98 F (36.7 C) 98.8 F (37.1 C) 98.2 F (36.8 C)  TempSrc: Oral Oral Oral Oral  SpO2: 97% 100% 98% 100%  Weight:      Height:        Data Reviewed:  There are no new results to review at this time.  Family Communication:   Disposition: Status is: Observation The patient remains OBS appropriate and will d/c before 2 midnights.  Planned Discharge Destination: Home    Time spent: 15 minutes  Author: Cristela Felt, MD 08/22/2022 12:37 PM  For on call review [email or web address].

## 2022-08-23 DIAGNOSIS — N3001 Acute cystitis with hematuria: Secondary | ICD-10-CM | POA: Diagnosis not present

## 2022-08-23 LAB — CBC
HCT: 33.4 % — ABNORMAL LOW (ref 39.0–52.0)
Hemoglobin: 11 g/dL — ABNORMAL LOW (ref 13.0–17.0)
MCH: 26.8 pg (ref 26.0–34.0)
MCHC: 32.9 g/dL (ref 30.0–36.0)
MCV: 81.3 fL (ref 80.0–100.0)
Platelets: 212 10*3/uL (ref 150–400)
RBC: 4.11 MIL/uL — ABNORMAL LOW (ref 4.22–5.81)
RDW: 14.3 % (ref 11.5–15.5)
WBC: 6.2 10*3/uL (ref 4.0–10.5)
nRBC: 0 % (ref 0.0–0.2)

## 2022-08-23 LAB — URINE CULTURE: Culture: NO GROWTH

## 2022-08-23 LAB — COMPREHENSIVE METABOLIC PANEL WITH GFR
ALT: 13 U/L (ref 0–44)
AST: 14 U/L — ABNORMAL LOW (ref 15–41)
Albumin: 3.1 g/dL — ABNORMAL LOW (ref 3.5–5.0)
Alkaline Phosphatase: 40 U/L (ref 38–126)
Anion gap: 6 (ref 5–15)
BUN: 20 mg/dL (ref 8–23)
CO2: 26 mmol/L (ref 22–32)
Calcium: 8.6 mg/dL — ABNORMAL LOW (ref 8.9–10.3)
Chloride: 108 mmol/L (ref 98–111)
Creatinine, Ser: 1.27 mg/dL — ABNORMAL HIGH (ref 0.61–1.24)
GFR, Estimated: >60 mL/min
Glucose, Bld: 99 mg/dL (ref 70–99)
Potassium: 3.7 mmol/L (ref 3.5–5.1)
Sodium: 140 mmol/L (ref 135–145)
Total Bilirubin: 0.7 mg/dL (ref 0.3–1.2)
Total Protein: 6.8 g/dL (ref 6.5–8.1)

## 2022-08-23 MED ORDER — SULFAMETHOXAZOLE-TRIMETHOPRIM 800-160 MG PO TABS
1.0000 | ORAL_TABLET | Freq: Two times a day (BID) | ORAL | Status: DC
  Administered 2022-08-23: 1 via ORAL
  Filled 2022-08-23: qty 1

## 2022-08-23 MED ORDER — HYDROXYCHLOROQUINE SULFATE 200 MG PO TABS
200.0000 mg | ORAL_TABLET | Freq: Every day | ORAL | Status: DC
  Filled 2022-08-23: qty 1

## 2022-08-23 MED ORDER — SULFAMETHOXAZOLE-TRIMETHOPRIM 800-160 MG PO TABS: 1.0000 | ORAL_TABLET | Freq: Two times a day (BID) | ORAL | 0 refills | Status: AC

## 2022-08-23 MED ORDER — SODIUM CHLORIDE 0.9 % IV SOLN: INTRAVENOUS | Status: DC

## 2022-08-23 NOTE — Plan of Care (Signed)

## 2022-08-23 NOTE — Progress Notes (Signed)
Mobility Specialist - Progress Note   08/23/22 0921  Mobility  Activity Ambulated independently in hallway  Level of Assistance Independent  Assistive Device None  Distance Ambulated (ft) 350 ft  Activity Response Tolerated well  Mobility Referral Yes  $Mobility charge 1 Mobility   Pt received in recliner and agreeable to mobility. Pt had no complaints during mobility. Pt to recliner after session with all needs met & family in room.   [facility]

## 2022-08-23 NOTE — Progress Notes (Signed)
Discharge instructions provided to pt. He verbalized understanding of all information. His pulmonologist is also his PCP, & while there is no date on the AVS, pt stated he has an appointment already set for 08/31/22. Verbalized knowledge for need to pick up Bactrim & will follow directions provided by pharmacy. He also understands need to stop losartan & stated he no longer takes or has any Norco, which was also noted for him to stop. Transported pt via w/c outside to wife. Confirmed pt was safely seated without s/s of distress.

## 2022-08-23 NOTE — Discharge Summary (Signed)
Physician Discharge Summary  Jimmy Mcclure [id number] DOB: 05/14/1956 DOA: 08/21/2022  PCP: Vincente Liberty, MD  Admit date: 08/21/2022 Discharge date: 08/23/2022  Admitted From: Home Discharge disposition: Home  Recommendations at discharge:  Bactrim DS twice daily for 8 more days Continue amlodipine for blood pressure control.  Stop losartan/HCTZ.  Continue to monitor blood pressure at home. Follow-up with PCP as an outpatient.   Brief narrative: Jimmy Mcclure is a 66 y.o. male with PMH significant for HTN, BPH, vitiligo, lichen planopilaris, incarcerated left inguinal hernia s/p repair 2017. 10/29, patient presented to the ED with complaint of right-sided groin pain for 2 days.  In the ED, patient was afebrile, hemodynamically stable Labs with WC count 12.1, hemoglobin 12.7, platelet 255, lactic acid 0.8, potassium low at 2.8, creatinine elevated to 1.69 Urinalysis with hazy yellow urine with moderate leukocytes, positive nitrite. CT of abdomen did not show any acute abnormality of abdomen or pelvis.  It showed large right hydrocele, no evidence of inguinal hernia. Ultrasound scrotum showed possible right orchitis moderate minimally complex right hydrocele and mild left varicocele. Patient was started on IV Rocephin Admitted to hospitalist service See below for details  Subjective: Patient was seen and examined this morning.  Pleasant elderly African-American male.  Sitting up in chair.  Not in distress.  No new symptoms.  Feels better.  Feels ready to go home. Remains afebrile, hemodynamically stable Last set of labs this morning showed WC of 6.2, hemoglobin 11, creatinine better at 1.27  Hospital course: Acute cystitis with hematuria Possible right orchitis Presented with right groin pain.   Urinalysis with moderate leukocytes, positive nitrate. CT abdomen, pelvis and ultrasound scrotum as above showing possible right orchitis and minimally complex right  hydrocele.   Currently on empiric IV Rocephin. Urine culture did not show any growth. WBC count normal, lactic acid level normal.  No fever. Discussed with pharmacy this morning.  Because of the presence of orchitis, patient will be discharged on a course of Bactrim DS twice daily for next 8 days to complete a 10-day course. Recent Labs  Lab 08/21/22 1512 08/21/22 2049 08/22/22 0419 08/23/22 0351  WBC 12.1*  --  8.7 6.2  LATICACIDVEN  --  0.8  --   --    Essential hypertension Continue amlodipine.  Losartan/HCTZ on hold because of AKI Blood remains normal.  I instructed the patient to continue amlodipine at home and keep losartan HCTZ on hold.  AKI on CKD 2 Baseline creatinine 1.33.  Presented with creatinine 1.69 in the setting of dehydration. Improving with IV fluids.  Continue oral hydration at home Recent Labs    08/21/22 1512 08/22/22 0419 08/23/22 0351  BUN 21 22 20   CREATININE 1.69* 1.37* 1.27*   Hypokalemia Potassium level was significantly low at 2.8 at presentation.  Improved with replacement.   Recent Labs  Lab 08/21/22 1512 08/21/22 2049 08/22/22 0419 08/23/22 0351  K 2.8*  --  3.3* 3.7  MG  --  2.3 2.4  --   PHOS  --   --  3.3  --    Vitiligo, lichen planopilaris Continue hydroxychloroquine  Wounds:  - Incision (Closed) 07/16/16 Groin Left (Active)  Date First Assessed/Time First Assessed: 07/16/16 1130   Location: Groin  Location Orientation: Left    Assessments 07/16/2016 12:34 PM 07/16/2016  2:15 PM  Dressing Type Liquid skin adhesive Liquid skin adhesive  Dressing Clean;Dry;Intact Clean;Dry;Intact  Site / Wound Assessment Clean;Dry Clean;Dry  Margins Attached edges (approximated) Attached  edges (approximated)  Closure Skin glue Skin glue  Drainage Amount None None  Treatment Ice applied --     No associated orders.    Discharge Exam:   Vitals:   08/22/22 0910 08/22/22 1206 08/22/22 1953 08/23/22 0418  BP: 120/66 122/72 117/72 126/70   Pulse: 67 63 68 70  Resp: 16 16 18 20   Temp: 98.8 F (37.1 C) 98.2 F (36.8 C) 98.3 F (36.8 C) 98.4 F (36.9 C)  TempSrc: Oral Oral Oral Oral  SpO2: 98% 100% 98% 96%  Weight:      Height:        Body mass index is 30.71 kg/m.  General exam: Pleasant, elderly African-American male.  Not in distress Skin: No rashes, lesions or ulcers. HEENT: Atraumatic, normocephalic, no obvious bleeding Lungs: Clear to auscultation bilaterally CVS: Regular rate and rhythm, no murmur GI/Abd soft, nontender, nondistended, bowel present CNS: Alert, awake, oriented x3 Psychiatry: Mood appropriate Extremities: No pedal edema, no calf tenderness  Follow ups:    Follow-up Information     [facility]. Schedule an appointment as soon as possible for a visit .   Contact information: [place]        Vincente Liberty, MD Follow up.   Specialty: Pulmonary Disease Contact information: [address] [place] [address]                 Discharge Instructions:   Discharge Instructions     Ambulatory referral to Urology   Complete by: As directed    Call MD for:  difficulty breathing, headache or visual disturbances   Complete by: As directed    Call MD for:  extreme fatigue   Complete by: As directed    Call MD for:  hives   Complete by: As directed    Call MD for:  persistant dizziness or light-headedness   Complete by: As directed    Call MD for:  persistant nausea and vomiting   Complete by: As directed    Call MD for:  severe uncontrolled pain   Complete by: As directed    Call MD for:  temperature >100.4   Complete by: As directed    Diet - low sodium heart healthy   Complete by: As directed    Discharge instructions   Complete by: As directed    Recommendations at discharge:   Bactrim DS twice daily for 8 more days  Continue amlodipine for blood pressure control.  Stop  losartan/HCTZ.  Continue to monitor blood pressure at home.  Follow-up with PCP as an outpatient.  General discharge instructions: Follow with Primary MD Vincente Liberty, MD in 7 days  Please request your PCP  to go over your hospital tests, procedures, radiology results at the follow up. Please get your medicines reviewed and adjusted.  Your PCP may decide to repeat certain labs or tests as needed. Do not drive, operate heavy machinery, perform activities at heights, swimming or participation in water activities or provide baby sitting services if your were admitted for syncope or siezures until you have seen by Primary MD or a Neurologist and advised to do so again. [place] Controlled Substance Reporting System database was reviewed. Do not drive, operate heavy machinery, perform activities at heights, swim, participate in water activities or provide baby-sitting services while on medications for pain, sleep and mood until your outpatient physician has reevaluated you and advised to do so again.  You are strongly recommended to comply with the dose, frequency and duration of prescribed medications. Activity: As tolerated with Full fall precautions use walker/cane & assistance as needed Avoid using any recreational substances like cigarette, tobacco, alcohol, or non-prescribed drug. If you experience worsening of your admission symptoms, develop shortness of breath, life threatening emergency, suicidal or homicidal thoughts you must seek medical attention immediately by calling 911 or calling your MD immediately  if symptoms less severe. You must read complete instructions/literature along with all the possible adverse reactions/side effects for all the medicines you take and that have been prescribed to you. Take any new medicine only after you have completely understood and accepted all the possible adverse reactions/side effects.  Wear Seat belts while driving. You were cared for by a  hospitalist during your hospital stay. If you have any questions about your discharge medications or the care you received while you were in the hospital after you are discharged, you can call the unit and ask to speak with the hospitalist or the covering physician. Once you are discharged, your primary care physician will handle any further medical issues. Please note that NO REFILLS for any discharge medications will be authorized once you are discharged, as it is imperative that you return to your primary care physician (or establish a relationship with a primary care physician if you do not have one).   Increase activity slowly   Complete by: As directed        Discharge Medications:   Allergies as of 08/23/2022       Reactions   Lactose Intolerance (gi) Other (See Comments)   GI UPSET        Medication List     STOP taking these medications    HYDROcodone-acetaminophen 5-325 MG tablet Commonly known as: NORCO/VICODIN   losartan-hydrochlorothiazide 100-25 MG tablet Commonly known as: HYZAAR       TAKE these medications    Alphagan P 0.1 % Soln Generic drug: brimonidine Place 1 drop into both eyes 3 (three) times daily.   amLODipine 10 MG tablet Commonly known as: NORVASC Take 10 mg by mouth daily.   dorzolamide 2 % ophthalmic solution Commonly known as: TRUSOPT Place 1 drop into both eyes 3 (three) times daily.   hydroxychloroquine 200 MG tablet Commonly known as: PLAQUENIL Take 200 mg by mouth daily.   Lumigan 0.01 % Soln Generic drug: bimatoprost Place 1 drop into both eyes at bedtime.   sulfamethoxazole-trimethoprim 800-160 MG tablet Commonly known as: BACTRIM DS Take 1 tablet by mouth every 12 (twelve) hours for 8 days.   timolol 0.5 % ophthalmic solution Commonly known as: TIMOPTIC Place 1 drop into both eyes 2 (two) times daily.   Vitamin D (Ergocalciferol) 1.25 MG (50000 UNIT) Caps capsule Commonly known as: DRISDOL Take 50,000 Units by mouth  every 7 (seven) days.         The results of significant diagnostics from this hospitalization (including imaging, microbiology, ancillary and laboratory) are listed below for reference.    Procedures and Diagnostic Studies:   US SCROTUM W/DOPPLER  Result Date: 08/21/2022 CLINICAL DATA:  Scrotal swelling. EXAM: SCROTAL ULTRASOUND DOPPLER ULTRASOUND OF THE TESTICLES TECHNIQUE: Complete ultrasound examination of the testicles, epididymis, and other scrotal structures was performed. Color and spectral Doppler ultrasound were also utilized to evaluate blood flow to the testicles. COMPARISON:  CT abdomen pelvis dated 08/21/2022. FINDINGS: Right testicle Measurements: 4.5 x 3.6 x 3.4 cm. The right testicle appears hyperemic. Clinical correlation is recommended  to evaluate for possibility of orchitis. Left testicle Measurements: 3.4 x 1.5 x 2.4 cm. The left testicle is slightly heterogeneous. No focal mass. Right epididymis:  Normal in size and appearance. Left epididymis:  Normal in size and appearance. Hydrocele: Right-sided hydrocele measuring 8.8 x 5.7 x 7.2 cm. There is floating low-level echogenic debris within the right hydrocele. Varicocele:  Mild left varicocele. Pulsed Doppler interrogation of both testes demonstrates normal low resistance arterial and venous waveforms bilaterally. IMPRESSION: 1. Possible right orchitis. Correlation with clinical exam and urinalysis recommended. 2. Moderate minimally complex right hydrocele. 3. Mild left varicocele. Electronically Signed   By: Elgie Collard M.D.   On: 08/21/2022 23:14   CT ABDOMEN PELVIS W CONTRAST  Result Date: 08/21/2022 CLINICAL DATA:  Inguinal hernia, right groin pain. EXAM: CT ABDOMEN AND PELVIS WITH CONTRAST TECHNIQUE: Multidetector CT imaging of the abdomen and pelvis was performed using the standard protocol following bolus administration of intravenous contrast. RADIATION DOSE REDUCTION: This exam was performed according to the  departmental dose-optimization program which includes automated exposure control, adjustment of the mA and/or kV according to patient size and/or use of iterative reconstruction technique. CONTRAST:  OMNIPAQUE IOHEXOL 300 MG/ML  SOLN COMPARISON:  None Available. FINDINGS: Lower chest: No acute abnormality. Hepatobiliary: No focal liver abnormality is seen. No gallstones, gallbladder wall thickening, or biliary dilatation. Pancreas: Unremarkable. No pancreatic ductal dilatation or surrounding inflammatory changes. Spleen: Normal in size without focal abnormality. Adrenals/Urinary Tract: Adrenal glands are unremarkable. Kidneys are normal, without renal calculi, focal lesion, or hydronephrosis. Bladder is unremarkable. Stomach/Bowel: Stomach is within normal limits. Appendix appears normal. No evidence of bowel wall thickening, distention, or inflammatory changes. Vascular/Lymphatic: No significant vascular findings are present. No enlarged abdominal or pelvic lymph nodes. Reproductive: Prostate is unremarkable. Other: No abdominal wall hernia or abnormality. No abdominopelvic ascites. Large right hydrocele present. Musculoskeletal: No acute or significant osseous findings. IMPRESSION: 1. Large right hydrocele. 2. No evidence of inguinal hernia. 3. No acute abnormality of the abdomen or pelvis. Electronically Signed   By: Darliss Cheney M.D.   On: 08/21/2022 21:40     Labs:   Basic Metabolic Panel: Recent Labs  Lab 08/21/22 1512 08/21/22 2049 08/22/22 0419 08/23/22 0351  NA 141  --  139 140  K 2.8*  --  3.3* 3.7  CL 102  --  105 108  CO2 29  --  28 26  GLUCOSE 113*  --  109* 99  BUN 21  --  22 20  CREATININE 1.69*  --  1.37* 1.27*  CALCIUM 9.6  --  8.9 8.6*  MG  --  2.3 2.4  --   PHOS  --   --  3.3  --    GFR Estimated Creatinine Clearance: 65 mL/min (A) (by C-G formula based on SCr of 1.27 mg/dL (H)). Liver Function Tests: Recent Labs  Lab 08/22/22 0419 08/23/22 0351  AST 14* 14*   ALT 15 13  ALKPHOS 47 40  BILITOT 0.9 0.7  PROT 7.4 6.8  ALBUMIN 3.4* 3.1*   No results for input(s): "LIPASE", "AMYLASE" in the last 168 hours. No results for input(s): "AMMONIA" in the last 168 hours. Coagulation profile No results for input(s): "INR", "PROTIME" in the last 168 hours.  CBC: Recent Labs  Lab 08/21/22 1512 08/22/22 0419 08/23/22 0351  WBC 12.1* 8.7 6.2  NEUTROABS 10.0* 6.2  --   HGB 12.7* 11.6* 11.0*  HCT 39.2 35.4* 33.4*  MCV 81.2 80.5 81.3  PLT  255 243 212   Cardiac Enzymes: No results for input(s): "CKTOTAL", "CKMB", "CKMBINDEX", "TROPONINI" in the last 168 hours. BNP: Invalid input(s): "POCBNP" CBG: No results for input(s): "GLUCAP" in the last 168 hours. D-Dimer No results for input(s): "DDIMER" in the last 72 hours. Hgb A1c No results for input(s): "HGBA1C" in the last 72 hours. Lipid Profile No results for input(s): "CHOL", "HDL", "LDLCALC", "TRIG", "CHOLHDL", "LDLDIRECT" in the last 72 hours. Thyroid function studies No results for input(s): "TSH", "T4TOTAL", "T3FREE", "THYROIDAB" in the last 72 hours.  Invalid input(s): "FREET3" Anemia work up No results for input(s): "VITAMINB12", "FOLATE", "FERRITIN", "TIBC", "IRON", "RETICCTPCT" in the last 72 hours. Microbiology Recent Results (from the past 240 hour(s))  Urine Culture     Status: None   Collection Time: 08/22/22  8:02 AM   Specimen: Urine, Clean Catch  Result Value Ref Range Status   Specimen Description   Final    URINE, CLEAN CATCH Performed at [facility], [place] [address] [place], [place] [address]    Special Requests   Final    NONE Performed at [facility], [place] [address] [place], [place] [address]    Culture   Final    NO GROWTH Performed at [facility], [place] [address] [place], [place] [address]    Report Status 08/23/2022 FINAL  Final    Time coordinating discharge: 35 minutes  Signed: Cheyrl Buley  [facility] 08/23/2022, 11:45 AM

## 2022-08-23 NOTE — TOC Progression Note (Signed)
Transition of Care [facility]) - Progression Note    Patient Details  Name: SHAWNTA ZIMBELMAN MRN: [id number] Date of Birth: 1956-05-02  Transition of [facility]) CM/SW Contact  Purcell Mouton, RN Phone Number: 08/23/2022, 11:28 AM  Clinical Narrative:      Transition of Care (TOC) Screening Note   Patient Details  Name: HAMLET LASECKI Date of Birth: 1956/07/11   Transition of Care [facility]) CM/SW Contact:    Purcell Mouton, RN Phone Number: 08/23/2022, 11:28 AM    Transition of Care Department (TOC) has reviewed patient and no TOC needs have been identified at this time. We will continue to monitor patient advancement through interdisciplinary progression rounds. If new patient transition needs arise, please place a TOC consult.         Expected Discharge Plan and Services                                                 Social Determinants of Health (SDOH) Interventions    Readmission Risk Interventions     No data to display

## 2022-12-23 DIAGNOSIS — N432 Other hydrocele: Secondary | ICD-10-CM | POA: Diagnosis not present

## 2022-12-30 DIAGNOSIS — E559 Vitamin D deficiency, unspecified: Secondary | ICD-10-CM | POA: Diagnosis not present

## 2022-12-30 DIAGNOSIS — Z79899 Other long term (current) drug therapy: Secondary | ICD-10-CM | POA: Diagnosis not present

## 2022-12-30 DIAGNOSIS — E7800 Pure hypercholesterolemia, unspecified: Secondary | ICD-10-CM | POA: Diagnosis not present

## 2022-12-30 DIAGNOSIS — I119 Hypertensive heart disease without heart failure: Secondary | ICD-10-CM | POA: Diagnosis not present

## 2022-12-30 DIAGNOSIS — N452 Orchitis: Secondary | ICD-10-CM | POA: Diagnosis not present

## 2022-12-30 DIAGNOSIS — E1165 Type 2 diabetes mellitus with hyperglycemia: Secondary | ICD-10-CM | POA: Diagnosis not present

## 2023-01-12 DIAGNOSIS — N432 Other hydrocele: Secondary | ICD-10-CM | POA: Diagnosis not present

## 2023-01-12 DIAGNOSIS — N50811 Right testicular pain: Secondary | ICD-10-CM | POA: Diagnosis not present

## 2023-01-14 DIAGNOSIS — N1832 Chronic kidney disease, stage 3b: Secondary | ICD-10-CM | POA: Diagnosis not present

## 2023-01-14 DIAGNOSIS — N481 Balanitis: Secondary | ICD-10-CM | POA: Diagnosis not present

## 2023-01-14 DIAGNOSIS — N453 Epididymo-orchitis: Secondary | ICD-10-CM | POA: Diagnosis not present

## 2023-01-14 DIAGNOSIS — N433 Hydrocele, unspecified: Secondary | ICD-10-CM | POA: Diagnosis not present

## 2023-01-28 DIAGNOSIS — N529 Male erectile dysfunction, unspecified: Secondary | ICD-10-CM | POA: Diagnosis not present

## 2023-01-28 DIAGNOSIS — N481 Balanitis: Secondary | ICD-10-CM | POA: Diagnosis not present

## 2023-02-02 DIAGNOSIS — H524 Presbyopia: Secondary | ICD-10-CM | POA: Diagnosis not present

## 2023-02-28 DIAGNOSIS — N529 Male erectile dysfunction, unspecified: Secondary | ICD-10-CM | POA: Diagnosis not present

## 2023-02-28 DIAGNOSIS — N433 Hydrocele, unspecified: Secondary | ICD-10-CM | POA: Diagnosis not present

## 2023-03-07 ENCOUNTER — Other Ambulatory Visit: Payer: Self-pay | Admitting: Pulmonary Disease

## 2023-03-07 ENCOUNTER — Ambulatory Visit
Admission: RE | Admit: 2023-03-07 | Discharge: 2023-03-07 | Disposition: A | Discharge status: Home / Self Care | Payer: Medicare Other | Source: Ambulatory Visit | Attending: Pulmonary Disease | Admitting: Pulmonary Disease

## 2023-03-07 DIAGNOSIS — I119 Hypertensive heart disease without heart failure: Secondary | ICD-10-CM | POA: Diagnosis not present

## 2023-03-07 DIAGNOSIS — J209 Acute bronchitis, unspecified: Secondary | ICD-10-CM | POA: Diagnosis not present

## 2023-03-07 DIAGNOSIS — N452 Orchitis: Secondary | ICD-10-CM | POA: Diagnosis not present

## 2023-03-07 DIAGNOSIS — N433 Hydrocele, unspecified: Secondary | ICD-10-CM | POA: Diagnosis not present

## 2023-03-07 DIAGNOSIS — R059 Cough, unspecified: Secondary | ICD-10-CM | POA: Diagnosis not present

## 2023-03-07 DIAGNOSIS — E1165 Type 2 diabetes mellitus with hyperglycemia: Secondary | ICD-10-CM | POA: Diagnosis not present

## 2023-03-07 DIAGNOSIS — N500 Atrophy of testis: Secondary | ICD-10-CM | POA: Diagnosis not present

## 2023-03-07 DIAGNOSIS — N451 Epididymitis: Secondary | ICD-10-CM | POA: Diagnosis not present

## 2023-03-28 DIAGNOSIS — L661 Lichen planopilaris: Secondary | ICD-10-CM | POA: Diagnosis not present

## 2023-03-28 DIAGNOSIS — L80 Vitiligo: Secondary | ICD-10-CM | POA: Diagnosis not present

## 2023-04-06 DIAGNOSIS — H2513 Age-related nuclear cataract, bilateral: Secondary | ICD-10-CM | POA: Diagnosis not present

## 2023-04-06 DIAGNOSIS — H402231 Chronic angle-closure glaucoma, bilateral, mild stage: Secondary | ICD-10-CM | POA: Diagnosis not present

## 2023-04-06 DIAGNOSIS — Z79899 Other long term (current) drug therapy: Secondary | ICD-10-CM | POA: Diagnosis not present

## 2023-05-05 DIAGNOSIS — E559 Vitamin D deficiency, unspecified: Secondary | ICD-10-CM | POA: Diagnosis not present

## 2023-05-05 DIAGNOSIS — I119 Hypertensive heart disease without heart failure: Secondary | ICD-10-CM | POA: Diagnosis not present

## 2023-05-05 DIAGNOSIS — Z1331 Encounter for screening for depression: Secondary | ICD-10-CM | POA: Diagnosis not present

## 2023-05-05 DIAGNOSIS — E1165 Type 2 diabetes mellitus with hyperglycemia: Secondary | ICD-10-CM | POA: Diagnosis not present

## 2023-05-05 DIAGNOSIS — N452 Orchitis: Secondary | ICD-10-CM | POA: Diagnosis not present

## 2023-05-05 DIAGNOSIS — Z0001 Encounter for general adult medical examination with abnormal findings: Secondary | ICD-10-CM | POA: Diagnosis not present

## 2023-05-05 DIAGNOSIS — E7800 Pure hypercholesterolemia, unspecified: Secondary | ICD-10-CM | POA: Diagnosis not present

## 2023-06-03 DIAGNOSIS — N433 Hydrocele, unspecified: Secondary | ICD-10-CM | POA: Diagnosis not present

## 2023-06-03 DIAGNOSIS — N529 Male erectile dysfunction, unspecified: Secondary | ICD-10-CM | POA: Diagnosis not present

## 2023-06-03 DIAGNOSIS — N452 Orchitis: Secondary | ICD-10-CM | POA: Diagnosis not present

## 2023-08-10 DIAGNOSIS — H402231 Chronic angle-closure glaucoma, bilateral, mild stage: Secondary | ICD-10-CM | POA: Diagnosis not present

## 2023-08-10 DIAGNOSIS — Z79899 Other long term (current) drug therapy: Secondary | ICD-10-CM | POA: Diagnosis not present

## 2023-08-10 DIAGNOSIS — H2513 Age-related nuclear cataract, bilateral: Secondary | ICD-10-CM | POA: Diagnosis not present

## 2023-10-03 DIAGNOSIS — L661 Lichen planopilaris, unspecified: Secondary | ICD-10-CM | POA: Diagnosis not present

## 2023-10-03 DIAGNOSIS — L80 Vitiligo: Secondary | ICD-10-CM | POA: Diagnosis not present

## 2023-11-03 DIAGNOSIS — N452 Orchitis: Secondary | ICD-10-CM | POA: Diagnosis not present

## 2023-11-03 DIAGNOSIS — E1165 Type 2 diabetes mellitus with hyperglycemia: Secondary | ICD-10-CM | POA: Diagnosis not present

## 2023-11-03 DIAGNOSIS — Z79899 Other long term (current) drug therapy: Secondary | ICD-10-CM | POA: Diagnosis not present

## 2023-11-03 DIAGNOSIS — I119 Hypertensive heart disease without heart failure: Secondary | ICD-10-CM | POA: Diagnosis not present

## 2023-11-16 DIAGNOSIS — Z79899 Other long term (current) drug therapy: Secondary | ICD-10-CM | POA: Diagnosis not present

## 2023-11-16 DIAGNOSIS — H2513 Age-related nuclear cataract, bilateral: Secondary | ICD-10-CM | POA: Diagnosis not present

## 2023-11-16 DIAGNOSIS — H402231 Chronic angle-closure glaucoma, bilateral, mild stage: Secondary | ICD-10-CM | POA: Diagnosis not present

## 2024-02-14 DIAGNOSIS — H5213 Myopia, bilateral: Secondary | ICD-10-CM | POA: Diagnosis not present

## 2024-02-14 DIAGNOSIS — H2513 Age-related nuclear cataract, bilateral: Secondary | ICD-10-CM | POA: Diagnosis not present

## 2024-02-14 DIAGNOSIS — Z79899 Other long term (current) drug therapy: Secondary | ICD-10-CM | POA: Diagnosis not present

## 2024-02-14 DIAGNOSIS — H402231 Chronic angle-closure glaucoma, bilateral, mild stage: Secondary | ICD-10-CM | POA: Diagnosis not present

## 2024-03-02 DIAGNOSIS — N481 Balanitis: Secondary | ICD-10-CM | POA: Diagnosis not present

## 2024-03-02 DIAGNOSIS — N529 Male erectile dysfunction, unspecified: Secondary | ICD-10-CM | POA: Diagnosis not present

## 2024-04-10 DIAGNOSIS — E7800 Pure hypercholesterolemia, unspecified: Secondary | ICD-10-CM | POA: Diagnosis not present

## 2024-04-10 DIAGNOSIS — E1165 Type 2 diabetes mellitus with hyperglycemia: Secondary | ICD-10-CM | POA: Diagnosis not present

## 2024-04-10 DIAGNOSIS — I119 Hypertensive heart disease without heart failure: Secondary | ICD-10-CM | POA: Diagnosis not present

## 2024-04-10 DIAGNOSIS — Z125 Encounter for screening for malignant neoplasm of prostate: Secondary | ICD-10-CM | POA: Diagnosis not present

## 2024-04-10 DIAGNOSIS — N452 Orchitis: Secondary | ICD-10-CM | POA: Diagnosis not present

## 2024-04-10 DIAGNOSIS — E559 Vitamin D deficiency, unspecified: Secondary | ICD-10-CM | POA: Diagnosis not present

## 2024-04-10 DIAGNOSIS — Z79899 Other long term (current) drug therapy: Secondary | ICD-10-CM | POA: Diagnosis not present

## 2024-04-12 DIAGNOSIS — E1165 Type 2 diabetes mellitus with hyperglycemia: Secondary | ICD-10-CM | POA: Diagnosis not present

## 2024-05-22 DIAGNOSIS — D229 Melanocytic nevi, unspecified: Secondary | ICD-10-CM | POA: Diagnosis not present

## 2024-05-22 DIAGNOSIS — D224 Melanocytic nevi of scalp and neck: Secondary | ICD-10-CM | POA: Diagnosis not present

## 2024-05-22 DIAGNOSIS — D3611 Benign neoplasm of peripheral nerves and autonomic nervous system of face, head, and neck: Secondary | ICD-10-CM | POA: Diagnosis not present

## 2024-06-19 DIAGNOSIS — H402231 Chronic angle-closure glaucoma, bilateral, mild stage: Secondary | ICD-10-CM | POA: Diagnosis not present

## 2024-06-19 DIAGNOSIS — H2513 Age-related nuclear cataract, bilateral: Secondary | ICD-10-CM | POA: Diagnosis not present

## 2024-06-19 DIAGNOSIS — H5213 Myopia, bilateral: Secondary | ICD-10-CM | POA: Diagnosis not present

## 2024-06-19 DIAGNOSIS — Z79899 Other long term (current) drug therapy: Secondary | ICD-10-CM | POA: Diagnosis not present

## 2024-07-05 DIAGNOSIS — Z860101 Personal history of adenomatous and serrated colon polyps: Secondary | ICD-10-CM | POA: Diagnosis not present

## 2024-07-05 DIAGNOSIS — Z09 Encounter for follow-up examination after completed treatment for conditions other than malignant neoplasm: Secondary | ICD-10-CM | POA: Diagnosis not present

## 2024-07-05 DIAGNOSIS — D123 Benign neoplasm of transverse colon: Secondary | ICD-10-CM | POA: Diagnosis not present

## 2024-07-10 DIAGNOSIS — D123 Benign neoplasm of transverse colon: Secondary | ICD-10-CM | POA: Diagnosis not present

## 2024-07-24 DIAGNOSIS — L80 Vitiligo: Secondary | ICD-10-CM | POA: Diagnosis not present

## 2024-07-24 DIAGNOSIS — B078 Other viral warts: Secondary | ICD-10-CM | POA: Diagnosis not present

## 2024-07-24 DIAGNOSIS — L661 Lichen planopilaris, unspecified: Secondary | ICD-10-CM | POA: Diagnosis not present

## 2024-09-03 DIAGNOSIS — L80 Vitiligo: Secondary | ICD-10-CM | POA: Diagnosis not present

## 2024-09-03 DIAGNOSIS — L661 Lichen planopilaris, unspecified: Secondary | ICD-10-CM | POA: Diagnosis not present

## 2024-09-11 DIAGNOSIS — E1165 Type 2 diabetes mellitus with hyperglycemia: Secondary | ICD-10-CM | POA: Diagnosis not present

## 2024-09-11 DIAGNOSIS — Z79899 Other long term (current) drug therapy: Secondary | ICD-10-CM | POA: Diagnosis not present

## 2024-09-11 DIAGNOSIS — I119 Hypertensive heart disease without heart failure: Secondary | ICD-10-CM | POA: Diagnosis not present

## 2024-09-11 DIAGNOSIS — N452 Orchitis: Secondary | ICD-10-CM | POA: Diagnosis not present

## 2024-11-15 ENCOUNTER — Encounter: Payer: Self-pay | Admitting: Dietician

## 2024-11-15 ENCOUNTER — Encounter: Attending: Pulmonary Disease | Admitting: Dietician

## 2024-11-15 VITALS — Wt 221.4 lb

## 2024-11-15 DIAGNOSIS — E119 Type 2 diabetes mellitus without complications: Secondary | ICD-10-CM | POA: Insufficient documentation

## 2024-11-15 NOTE — Progress Notes (Signed)
 " Diabetes Self-Management Education  Visit Type: First/Initial  Appt. Start Time: 1023 Appt. End Time: 1057  11/15/2024  Mr. Jimmy Mcclure, identified by name and date of birth, is a 69 y.o. male with a diagnosis of Diabetes: (Patient-Rptd) (P) Pre-Diabetes.   ASSESSMENT  Primary concern: Weight loss  History includes: reviewed; glaucoma, heart murmur, HTN, type 2 diabetes, CKD stage 2 Labs noted: 04/10/24: A1c 6.0 Medications include: reviewed Supplements: none reported  Pt is here with his wife Charlene.  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   Pt reports he was diagnosed with prediabetes.   Pt states he got off the wagon and gained weight. He traveled from February 2024 to July 2025. Pt states he has been home for 6 months and did better with his heathy habits. Pt states he tries to walk 3-4 days per week for 1 hour (around 3 miles). Pt reports he also would do video aerobics. Pt reports he had a death recently that took him out of his routine.   Pt reports that wife cooks salmon, broccoli and that she is trying to change his eating habits.   Pt states they go out to eat for supper on Sundays.   Pt states him and his wife split a 16 ounce water bottle and averages one bottle of water per day.   Pt reports his wife cooks every other day and it will be enough for 2-3 meals afterwards.   Weight 221 lb 6.4 oz (100.4 kg). Body mass index is 31.77 kg/m.   Diabetes Self-Management Education - 11/14/24 2116       Visit Information   Visit Type First/Initial      Psychosocial Assessment   Patient Belief/Attitude about Diabetes Other (comment)    What is the hardest part about your diabetes right now, causing you the most concern, or is the most worrisome to you about your diabetes?   Making healty food and beverage choices;Being active    Self-care barriers None    Self-management support Doctor's office;Church;Family    Other persons present Spouse/SO    Patient Concerns  Nutrition/Meal planning;Healthy Lifestyle;Weight Control    Special Needs None    Preferred Learning Style No preference indicated    Learning Readiness Ready    How often do you need to have someone help you when you read instructions, pamphlets, or other written materials from your doctor or pharmacy? 1 - Never      Pre-Education Assessment   Patient understands the diabetes disease and treatment process. Needs Instruction    Patient understands incorporating nutritional management into lifestyle. Needs Instruction    Patient undertands incorporating physical activity into lifestyle. Needs Instruction    Patient understands using medications safely. Needs Instruction    Patient understands monitoring blood glucose, interpreting and using results Needs Instruction    Patient understands prevention, detection, and treatment of acute complications. Needs Instruction    Patient understands prevention, detection, and treatment of chronic complications. Needs Instruction    Patient understands how to develop strategies to address psychosocial issues. Needs Instruction    Patient understands how to develop strategies to promote health/change behavior. Needs Instruction      Dietary Intake   Breakfast Pancakes OR oatmeal and fruit    Snack (morning) apples or oranges    Lunch wendys or leftovers    Snack (afternoon) brownies    Dinner lentil soup with crackers OR chili with salad    Snack (evening) fruit or popcorn  Beverage(s) water, sweet tea      Activity / Exercise   Activity / Exercise Type Light (walking / raking leaves)      Patient Education   Healthy Eating Food label reading, portion sizes and measuring food.;Information on hints to eating out and maintain blood glucose control.    Being Active Role of exercise on diabetes management, blood pressure control and cardiac health.;Helped patient identify appropriate exercises in relation to his/her diabetes, diabetes complications  and other health issue.    Lifestyle and Health Coping Lifestyle issues that need to be addressed for better diabetes care      Individualized Goals (developed by patient)   Nutrition General guidelines for healthy choices and portions discussed    Physical Activity Exercise 3-5 times per week;30 minutes per day    Medications take my medication as prescribed    Monitoring  Not Applicable    Problem Solving Eating Pattern;Addressing barriers to behavior change    Reducing Risk examine blood glucose patterns;do foot checks daily    Health Coping Ask for help with psychological, social, or emotional issues;Discuss barriers to diabetes care with support person/system (comment specifics as needed)      Post-Education Assessment   Patient understands the diabetes disease and treatment process. Comprehends key points    Patient understands incorporating nutritional management into lifestyle. Comprehends key points    Patient undertands incorporating physical activity into lifestyle. Comprehends key points    Patient understands using medications safely. Comphrehends key points    Patient understands monitoring blood glucose, interpreting and using results Comprehends key points    Patient understands prevention, detection, and treatment of acute complications. Comprehends key points    Patient understands prevention, detection, and treatment of chronic complications. Comprehends key points    Patient understands how to develop strategies to address psychosocial issues. Comprehends key points    Patient understands how to develop strategies to promote health/change behavior. Comprehends key points      Outcomes   Expected Outcomes Demonstrated limited interest in learning.  Expect minimal changes    Future DMSE 2 months    Program Status Not Completed          Individualized Plan for Diabetes Self-Management Training:   Learning Objective:  Patient will have a greater understanding of  diabetes self-management. Patient education plan is to attend individual and/or group sessions per assessed needs and concerns.   Plan:   Pt Goals Do video aerobics 30-45 minutes every other day.  Drink a minimum of 32 ounces of water per day and slowly work up to 64 ounces.  Add less syrup to pancakes and incorporate more protein into breakfast  Expected Outcomes:  Demonstrated limited interest in learning.  Expect minimal changes  Education material provided: ADA - How to Thrive: A Guide for Your Journey with Diabetes  If problems or questions, patient to contact team via:  Phone and Email  Future DSME appointment: 2 months "

## 2025-02-19 ENCOUNTER — Encounter: Admitting: Dietician
